# Patient Record
Sex: Male | Born: 1955
Health system: Southern US, Community
[De-identification: ages and names within clinical notes are randomized; demographics above are authoritative.]

## PROBLEM LIST (undated history)

## (undated) DIAGNOSIS — I10 Essential (primary) hypertension: Secondary | ICD-10-CM

## (undated) DIAGNOSIS — I509 Heart failure, unspecified: Secondary | ICD-10-CM

## (undated) HISTORY — DX: Heart failure, unspecified: I50.9

---

## 1998-12-31 ENCOUNTER — Encounter: Payer: Self-pay | Admitting: *Deleted

## 1998-12-31 ENCOUNTER — Emergency Department (HOSPITAL_COMMUNITY): Admission: EM | Admit: 1998-12-31 | Discharge: 1998-12-31 | Payer: Self-pay | Admitting: *Deleted

## 1999-08-30 ENCOUNTER — Encounter: Payer: Self-pay | Admitting: Emergency Medicine

## 1999-08-30 ENCOUNTER — Emergency Department (HOSPITAL_COMMUNITY): Admission: EM | Admit: 1999-08-30 | Discharge: 1999-08-30 | Payer: Self-pay | Admitting: Emergency Medicine

## 2005-07-30 ENCOUNTER — Emergency Department (HOSPITAL_COMMUNITY): Admission: EM | Admit: 2005-07-30 | Discharge: 2005-07-30 | Payer: Self-pay | Admitting: Family Medicine

## 2008-04-27 ENCOUNTER — Emergency Department (HOSPITAL_COMMUNITY): Admission: EM | Admit: 2008-04-27 | Discharge: 2008-04-27 | Payer: Self-pay | Admitting: Emergency Medicine

## 2012-06-29 ENCOUNTER — Encounter (HOSPITAL_COMMUNITY): Payer: Self-pay | Admitting: Emergency Medicine

## 2012-06-29 ENCOUNTER — Emergency Department (HOSPITAL_COMMUNITY)
Admission: EM | Admit: 2012-06-29 | Discharge: 2012-06-29 | Disposition: A | Discharge status: Home / Self Care | Payer: Self-pay | Attending: Emergency Medicine | Admitting: Emergency Medicine

## 2012-06-29 DIAGNOSIS — H15009 Unspecified scleritis, unspecified eye: Secondary | ICD-10-CM | POA: Insufficient documentation

## 2012-06-29 DIAGNOSIS — E119 Type 2 diabetes mellitus without complications: Secondary | ICD-10-CM | POA: Insufficient documentation

## 2012-06-29 DIAGNOSIS — I10 Essential (primary) hypertension: Secondary | ICD-10-CM | POA: Insufficient documentation

## 2012-06-29 DIAGNOSIS — H00019 Hordeolum externum unspecified eye, unspecified eyelid: Secondary | ICD-10-CM | POA: Insufficient documentation

## 2012-06-29 HISTORY — DX: Essential (primary) hypertension: I10

## 2012-06-29 MED ORDER — TOBRAMYCIN 0.3 % OP SOLN: 2.0000 [drp] | OPHTHALMIC | Status: AC

## 2012-06-29 NOTE — ED Notes (Signed)
20/20 eyesight for both eyes

## 2012-06-29 NOTE — ED Provider Notes (Cosign Needed)
History  Scribed for Ward Givens, MD, the patient was seen in room TR02C/TR02C. This chart was scribed by Candelaria Stagers. The patient's care started at 5:18 PM   CSN: [id number]  Arrival date & time 06/29/12  1450   First MD Initiated Contact with Patient 06/29/12 1648      Chief Complaint  Patient presents with  . Eye Pain     The history is provided by the patient. No language interpreter was used.   Louis Trevino is a 56 y.o. male who presents to the Emergency Department complaining of redness of the right eye that started six weeks ago.  Pt states that he had a sty on the right eyelid that started six weeks ago when the redness and swelling started.  Pt has been having problems with his right eye for about 9 months.  He was seen by his PCP three days ago and given antibiotic.  He reports that since starting the antibiotic three days ago the swelling and redness has gotten worse.  He denies fever or trouble seeing.  He reports that the eye is draining clear and white discharge.  Pt has h/o diabetes.  Pt describes abscess forming on his forehead and nose but doesn't have one now. 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   [facility]   Past Medical History  Diagnosis Date  . Diabetes mellitus   . Hypertension     No past surgical history on file.  No family history on file.  History  Substance Use Topics  . Smoking status: Never Smoker   . Smokeless tobacco: Not on file  . Alcohol Use: Yes   Lives with spouse Lives at home employed   Review of Systems  Eyes: Positive for pain (right eye pain), discharge (right eye) and redness (right eye). Negative for visual disturbance.  All other systems reviewed and are negative.    Allergies  Review of patient's allergies indicates no known allergies.  Home Medications   Current Outpatient Rx  Name Route Sig Dispense Refill  . AMLODIPINE BESYLATE PO Oral Take 1 tablet by  mouth daily.     . ASPIRIN EC 325 MG PO TBEC Oral Take 650 mg by mouth 2 (two) times daily.    Marland Kitchen BENAZEPRIL HCL 40 MG PO TABS Oral Take 40 mg by mouth at bedtime.    Marland Kitchen DOXAZOSIN MESYLATE 8 MG PO TABS Oral Take 8 mg by mouth at bedtime.    Marland Kitchen GLIPIZIDE PO Oral Take 1 tablet by mouth daily.     Marland Kitchen METOPROLOL TARTRATE 100 MG PO TABS Oral Take 100 mg by mouth 2 (two) times daily.    . SULFAMETHOXAZOLE-TMP DS 800-160 MG PO TABS Oral Take 1 tablet by mouth 2 (two) times daily.      BP 166/108  Pulse 91  Temp 98.1 F (36.7 C)  Resp 16  SpO2 96%  Vital signs normal except hypertension    Physical Exam  Nursing note and vitals reviewed. Constitutional: He is oriented to person, place, and time. He appears well-developed and well-nourished. No distress.  HENT:  Head: Normocephalic and atraumatic.  Right Ear: External ear normal.  Left Ear: External ear normal.  Eyes: EOM are normal. Pupils are equal, round, and reactive to light. Right eye exhibits chemosis and hordeolum. Right eye exhibits no discharge and no exudate. No foreign body present in the right eye. Left eye exhibits  no chemosis, no discharge, no exudate and no hordeolum. No foreign body present in the left eye. Right conjunctiva is injected. Left conjunctiva is not injected.         Intense injection of the lateral sclera of right eye.  Swelling of the sclera near the cornea.  Swelling and redness of the upper eyelied laterally involving lid margin.  When upper eyelid  lifted small pustular area found underneath the external sty.  Left eye normal.  Fluorescent stain with negative uptake.  Has mild swelling of the lower eyelid  Neck: Normal range of motion.  Pulmonary/Chest: No respiratory distress.  Musculoskeletal: Normal range of motion.  Neurological: He is alert and oriented to person, place, and time.  Skin: Skin is warm and dry. He is not diaphoretic.  Psychiatric: He has a normal mood and affect. His behavior is normal.     ED Course  Procedures   VA noted   DIAGNOSTIC STUDIES: Oxygen Saturation is 96% on room air, normal by my interpretation.    COORDINATION OF CARE:  CBG ordered, I was not given result.    1. Sty   2. Scleritis and episcleritis    New Prescriptions   TOBRAMYCIN (TOBREX) 0.3 % OPHTHALMIC SOLUTION    Place 2 drops into the right eye every 4 (four) hours.    Plan discharge  Devoria Albe, MD, FACEP    MDM   I personally performed the services described in this documentation, which was scribed in my presence. The recorded information has been reviewed and considered.  Devoria Albe, MD, Armando Gang        Ward Givens, MD 06/29/12 2012

## 2012-06-29 NOTE — ED Notes (Signed)
States was given bactrim but not helping

## 2012-06-29 NOTE — ED Notes (Signed)
Has fb in rt eye ? Bug  Has seen an ucc. Rt eye swollen and red

## 2013-07-25 ENCOUNTER — Emergency Department (HOSPITAL_COMMUNITY): Payer: Medicaid Other

## 2013-07-25 ENCOUNTER — Inpatient Hospital Stay (HOSPITAL_COMMUNITY)
Admission: EM | Admit: 2013-07-25 | Discharge: 2013-07-28 | DRG: 292 | Disposition: A | Discharge status: Home / Self Care | Payer: Medicaid Other | Attending: Internal Medicine | Admitting: Internal Medicine

## 2013-07-25 ENCOUNTER — Encounter (HOSPITAL_COMMUNITY): Payer: Self-pay | Admitting: Family Medicine

## 2013-07-25 ENCOUNTER — Ambulatory Visit: Payer: Self-pay | Admitting: Family Medicine

## 2013-07-25 ENCOUNTER — Encounter: Payer: Self-pay | Admitting: Family Medicine

## 2013-07-25 ENCOUNTER — Inpatient Hospital Stay (HOSPITAL_COMMUNITY): Payer: Medicaid Other

## 2013-07-25 VITALS — BP 150/110 | HR 116 | Temp 97.6°F | Resp 20 | Wt 238.0 lb

## 2013-07-25 DIAGNOSIS — R0602 Shortness of breath: Secondary | ICD-10-CM | POA: Insufficient documentation

## 2013-07-25 DIAGNOSIS — I10 Essential (primary) hypertension: Secondary | ICD-10-CM | POA: Diagnosis present

## 2013-07-25 DIAGNOSIS — I498 Other specified cardiac arrhythmias: Secondary | ICD-10-CM | POA: Diagnosis present

## 2013-07-25 DIAGNOSIS — Z79899 Other long term (current) drug therapy: Secondary | ICD-10-CM

## 2013-07-25 DIAGNOSIS — I5020 Unspecified systolic (congestive) heart failure: Secondary | ICD-10-CM | POA: Diagnosis present

## 2013-07-25 DIAGNOSIS — E876 Hypokalemia: Secondary | ICD-10-CM | POA: Diagnosis present

## 2013-07-25 DIAGNOSIS — R000 Tachycardia, unspecified: Secondary | ICD-10-CM

## 2013-07-25 DIAGNOSIS — I509 Heart failure, unspecified: Secondary | ICD-10-CM | POA: Diagnosis present

## 2013-07-25 DIAGNOSIS — IMO0001 Reserved for inherently not codable concepts without codable children: Secondary | ICD-10-CM | POA: Diagnosis present

## 2013-07-25 DIAGNOSIS — Z9119 Patient's noncompliance with other medical treatment and regimen: Secondary | ICD-10-CM | POA: Diagnosis not present

## 2013-07-25 DIAGNOSIS — Z7982 Long term (current) use of aspirin: Secondary | ICD-10-CM | POA: Diagnosis not present

## 2013-07-25 DIAGNOSIS — I5023 Acute on chronic systolic (congestive) heart failure: Principal | ICD-10-CM | POA: Diagnosis present

## 2013-07-25 DIAGNOSIS — Z91199 Patient's noncompliance with other medical treatment and regimen due to unspecified reason: Secondary | ICD-10-CM

## 2013-07-25 DIAGNOSIS — E669 Obesity, unspecified: Secondary | ICD-10-CM | POA: Diagnosis present

## 2013-07-25 DIAGNOSIS — E119 Type 2 diabetes mellitus without complications: Secondary | ICD-10-CM | POA: Diagnosis present

## 2013-07-25 LAB — CBC
Hemoglobin: 14.6 g/dL (ref 13.0–17.0)
MCH: 31.4 pg (ref 26.0–34.0)
MCH: 31 pg (ref 26.0–34.0)
MCHC: 35.7 g/dL (ref 30.0–36.0)
Platelets: 226 10*3/uL (ref 150–400)
Platelets: 219 10*3/uL (ref 150–400)
RBC: 5.03 MIL/uL (ref 4.22–5.81)
RBC: 4.71 MIL/uL (ref 4.22–5.81)
RDW: 13.7 % (ref 11.5–15.5)
WBC: 11.9 10*3/uL — ABNORMAL HIGH (ref 4.0–10.5)

## 2013-07-25 LAB — BASIC METABOLIC PANEL
CO2: 22 mEq/L (ref 19–32)
Calcium: 9 mg/dL (ref 8.4–10.5)
GFR calc Af Amer: >90 mL/min (ref >90)
Sodium: 132 mEq/L — ABNORMAL LOW (ref 135–145)

## 2013-07-25 LAB — CREATININE, SERUM
Creatinine, Ser: 1.25 mg/dL (ref 0.50–1.35)
GFR calc non Af Amer: 63 mL/min — ABNORMAL LOW (ref >90)

## 2013-07-25 LAB — PRO B NATRIURETIC PEPTIDE: Pro B Natriuretic peptide (BNP): 1221 pg/mL — ABNORMAL HIGH (ref 0–125)

## 2013-07-25 LAB — POCT I-STAT TROPONIN I: Troponin i, poc: 0.05 ng/mL (ref 0.00–0.08)

## 2013-07-25 LAB — GLUCOSE, CAPILLARY
Glucose-Capillary: 284 mg/dL — ABNORMAL HIGH (ref 70–99)
Glucose-Capillary: 409 mg/dL — ABNORMAL HIGH (ref 70–99)

## 2013-07-25 LAB — TROPONIN I
Troponin I: <0.3 ng/mL (ref <0.30)
Troponin I: <0.3 ng/mL (ref <0.30)

## 2013-07-25 LAB — HEMOGLOBIN A1C: Hgb A1c MFr Bld: 11.9 % — ABNORMAL HIGH (ref <5.7)

## 2013-07-25 MED ORDER — IOHEXOL 350 MG/ML SOLN
100.0000 mL | Freq: Once | INTRAVENOUS | Status: AC | PRN
  Administered 2013-07-25: 100 mL via INTRAVENOUS

## 2013-07-25 MED ORDER — SODIUM CHLORIDE 0.9 % IJ SOLN
3.0000 mL | INTRAMUSCULAR | Status: DC | PRN
  Administered 2013-07-27: 3 mL via INTRAVENOUS

## 2013-07-25 MED ORDER — ACETAMINOPHEN 650 MG RE SUPP: 650.0000 mg | Freq: Four times a day (QID) | RECTAL | Status: DC | PRN

## 2013-07-25 MED ORDER — HYDROCODONE-ACETAMINOPHEN 5-325 MG PO TABS: 1.0000 | ORAL_TABLET | ORAL | Status: DC | PRN

## 2013-07-25 MED ORDER — METOPROLOL TARTRATE 25 MG PO TABS
100.0000 mg | ORAL_TABLET | Freq: Two times a day (BID) | ORAL | Status: DC
  Administered 2013-07-25: 100 mg via ORAL
  Filled 2013-07-25: qty 4

## 2013-07-25 MED ORDER — LABETALOL HCL 5 MG/ML IV SOLN
10.0000 mg | INTRAVENOUS | Status: DC | PRN
  Filled 2013-07-25: qty 4

## 2013-07-25 MED ORDER — INSULIN ASPART 100 UNIT/ML ~~LOC~~ SOLN
0.0000 [IU] | Freq: Three times a day (TID) | SUBCUTANEOUS | Status: DC
  Administered 2013-07-25: 18:00:00 8 [IU] via SUBCUTANEOUS
  Administered 2013-07-26 (×2): via SUBCUTANEOUS
  Administered 2013-07-26 – 2013-07-27 (×3): 5 [IU] via SUBCUTANEOUS
  Administered 2013-07-27: 12:00:00 11 [IU] via SUBCUTANEOUS
  Administered 2013-07-28: 5 [IU] via SUBCUTANEOUS

## 2013-07-25 MED ORDER — ONDANSETRON HCL 4 MG PO TABS: 4.0000 mg | ORAL_TABLET | Freq: Four times a day (QID) | ORAL | Status: DC | PRN

## 2013-07-25 MED ORDER — ALBUTEROL SULFATE (5 MG/ML) 0.5% IN NEBU: 2.5000 mg | INHALATION_SOLUTION | RESPIRATORY_TRACT | Status: DC | PRN

## 2013-07-25 MED ORDER — INSULIN ASPART 100 UNIT/ML ~~LOC~~ SOLN: 0.0000 [IU] | Freq: Three times a day (TID) | SUBCUTANEOUS | Status: DC

## 2013-07-25 MED ORDER — HEPARIN SODIUM (PORCINE) 5000 UNIT/ML IJ SOLN
5000.0000 [IU] | Freq: Three times a day (TID) | INTRAMUSCULAR | Status: DC
  Administered 2013-07-25 – 2013-07-28 (×8): 5000 [IU] via SUBCUTANEOUS
  Filled 2013-07-25 (×11): qty 1

## 2013-07-25 MED ORDER — POTASSIUM CHLORIDE CRYS ER 20 MEQ PO TBCR
40.0000 meq | EXTENDED_RELEASE_TABLET | Freq: Once | ORAL | Status: AC
  Administered 2013-07-25: 40 meq via ORAL
  Filled 2013-07-25: qty 2

## 2013-07-25 MED ORDER — FUROSEMIDE 10 MG/ML IJ SOLN
40.0000 mg | Freq: Once | INTRAMUSCULAR | Status: AC
  Administered 2013-07-25: 40 mg via INTRAVENOUS
  Filled 2013-07-25: qty 4

## 2013-07-25 MED ORDER — FUROSEMIDE 10 MG/ML IJ SOLN
40.0000 mg | Freq: Two times a day (BID) | INTRAMUSCULAR | Status: DC
  Administered 2013-07-25 – 2013-07-26 (×3): 40 mg via INTRAVENOUS
  Filled 2013-07-25 (×4): qty 4

## 2013-07-25 MED ORDER — SODIUM CHLORIDE 0.9 % IJ SOLN
3.0000 mL | Freq: Two times a day (BID) | INTRAMUSCULAR | Status: DC
Start: 2013-07-25 — End: 2013-07-28
  Administered 2013-07-26: 22:00:00 3 mL via INTRAVENOUS

## 2013-07-25 MED ORDER — ASPIRIN EC 81 MG PO TBEC
81.0000 mg | DELAYED_RELEASE_TABLET | Freq: Every day | ORAL | Status: DC
  Administered 2013-07-25 – 2013-07-28 (×4): 81 mg via ORAL
  Filled 2013-07-25 (×4): qty 1

## 2013-07-25 MED ORDER — NITROGLYCERIN 2 % TD OINT
1.0000 [in_us] | TOPICAL_OINTMENT | Freq: Once | TRANSDERMAL | Status: AC
  Administered 2013-07-25: 1 [in_us] via TOPICAL
  Filled 2013-07-25: qty 1

## 2013-07-25 MED ORDER — INSULIN ASPART 100 UNIT/ML ~~LOC~~ SOLN
10.0000 [IU] | Freq: Once | SUBCUTANEOUS | Status: AC
  Administered 2013-07-25: 10 [IU] via SUBCUTANEOUS

## 2013-07-25 MED ORDER — ONDANSETRON HCL 4 MG/2ML IJ SOLN: 4.0000 mg | Freq: Four times a day (QID) | INTRAMUSCULAR | Status: DC | PRN

## 2013-07-25 MED ORDER — ALUM & MAG HYDROXIDE-SIMETH 200-200-20 MG/5ML PO SUSP: 30.0000 mL | Freq: Four times a day (QID) | ORAL | Status: DC | PRN

## 2013-07-25 MED ORDER — SODIUM CHLORIDE 0.9 % IJ SOLN
3.0000 mL | Freq: Two times a day (BID) | INTRAMUSCULAR | Status: DC
  Administered 2013-07-25 – 2013-07-28 (×5): 3 mL via INTRAVENOUS

## 2013-07-25 MED ORDER — BENAZEPRIL HCL 40 MG PO TABS
40.0000 mg | ORAL_TABLET | Freq: Every day | ORAL | Status: DC
  Filled 2013-07-25: qty 1

## 2013-07-25 MED ORDER — HYDRALAZINE HCL 20 MG/ML IJ SOLN: 10.0000 mg | INTRAMUSCULAR | Status: DC | PRN

## 2013-07-25 MED ORDER — AMLODIPINE BESYLATE 10 MG PO TABS
10.0000 mg | ORAL_TABLET | Freq: Every day | ORAL | Status: DC
  Administered 2013-07-25 – 2013-07-28 (×4): 10 mg via ORAL
  Filled 2013-07-25 (×4): qty 1

## 2013-07-25 MED ORDER — ACETAMINOPHEN 325 MG PO TABS
650.0000 mg | ORAL_TABLET | Freq: Four times a day (QID) | ORAL | Status: DC | PRN
  Administered 2013-07-26 – 2013-07-28 (×2): 650 mg via ORAL
  Filled 2013-07-25 (×2): qty 2

## 2013-07-25 MED ORDER — SODIUM CHLORIDE 0.9 % IV SOLN: 250.0000 mL | INTRAVENOUS | Status: DC | PRN

## 2013-07-25 MED ORDER — ALPRAZOLAM 0.5 MG PO TABS
0.5000 mg | ORAL_TABLET | Freq: Every day | ORAL | Status: DC | PRN
  Administered 2013-07-26: 18:00:00 0.5 mg via ORAL
  Filled 2013-07-25: qty 1

## 2013-07-25 MED ORDER — INSULIN ASPART 100 UNIT/ML ~~LOC~~ SOLN
10.0000 [IU] | Freq: Once | SUBCUTANEOUS | Status: AC
  Administered 2013-07-25: 10 [IU] via INTRAVENOUS
  Filled 2013-07-25: qty 1

## 2013-07-25 MED ORDER — LABETALOL HCL 5 MG/ML IV SOLN
20.0000 mg | Freq: Once | INTRAVENOUS | Status: AC
  Administered 2013-07-25: 20 mg via INTRAVENOUS
  Filled 2013-07-25: qty 4

## 2013-07-25 NOTE — ED Notes (Signed)
Called report to nurse on 4e. She verbalized concern over pt distolic bp. Also concern over pt increased hr. Paged dr Jerral Ralph (admitting doctor [phone]) and he is coming to see pt. Would like ct done before pt goes to floor. He will address concerns

## 2013-07-25 NOTE — ED Provider Notes (Signed)
CSN: [id number]     Arrival date & time 07/25/13  1124 History   First MD Initiated Contact with Patient 07/25/13 1145     Chief Complaint  Patient presents with  . Shortness of Breath   (Consider location/radiation/quality/duration/timing/severity/associated sxs/prior Treatment) HPI Patient with history of hypertension diabetes who has been on Lasix in the past for lower extremity edema presents with increased shortness of breath for the past 2-3 days. Patient states the shortness of breath it is worse with any exertion or when lying flat. He denies cough, fevers, chills or chest pain. He was taken off his Lasix one year ago. Patient has no new lower extremity edema or pain. Past Medical History  Diagnosis Date  . Diabetes mellitus   . Hypertension    History reviewed. No pertinent past surgical history. Family History  Problem Relation Age of Onset  . Heart disease Mother   . Stroke Mother   . Kidney failure Maternal Grandfather   . Stroke Paternal Grandmother    History  Substance Use Topics  . Smoking status: Never Smoker   . Smokeless tobacco: Not on file  . Alcohol Use: Yes    Review of Systems  Constitutional: Negative for fever and chills.  HENT: Negative for neck pain and neck stiffness.   Respiratory: Positive for shortness of breath. Negative for cough.   Cardiovascular: Negative for chest pain, palpitations and leg swelling.  Gastrointestinal: Negative for nausea, vomiting, abdominal pain and diarrhea.  Musculoskeletal: Negative for myalgias and back pain.  Skin: Negative for rash and wound.  Neurological: Negative for dizziness, weakness, light-headedness, numbness and headaches.  All other systems reviewed and are negative.    Allergies  Review of patient's allergies indicates no known allergies.  Home Medications   Current Outpatient Rx  Name  Route  Sig  Dispense  Refill  . AMLODIPINE BESYLATE PO   Oral   Take 10 mg by mouth daily.          Marland Kitchen  aspirin EC 325 MG tablet   Oral   Take 650 mg by mouth 2 (two) times daily.         . benazepril (LOTENSIN) 40 MG tablet   Oral   Take 40 mg by mouth at bedtime.         Marland Kitchen doxazosin (CARDURA) 8 MG tablet   Oral   Take 8 mg by mouth at bedtime.         Marland Kitchen GLIPIZIDE PO   Oral   Take 500 mg by mouth 2 (two) times daily.          . metoprolol (LOPRESSOR) 100 MG tablet   Oral   Take 100 mg by mouth 2 (two) times daily.          BP 201/107  Pulse 131  Temp(Src) 97.8 F (36.6 C) (Oral)  Resp 24  Ht 5\' 7"  (1.702 m)  Wt 240 lb 1.6 oz (108.909 kg)  BMI 37.6 kg/m2  SpO2 94% Physical Exam  Nursing note and vitals reviewed. Constitutional: He is oriented to person, place, and time. He appears well-developed and well-nourished. No distress.  HENT:  Head: Normocephalic and atraumatic.  Mouth/Throat: Oropharynx is clear and moist.  Eyes: EOM are normal. Pupils are equal, round, and reactive to light.  Neck: Normal range of motion. Neck supple.  Cardiovascular: Normal rate and regular rhythm.   Pulmonary/Chest: Effort normal. No respiratory distress. He has no wheezes. He has no rales. He exhibits no tenderness.  Mildly increased expiratory phase linked. Questionable bilateral crackles in bases  Abdominal: Soft. Bowel sounds are normal. He exhibits no distension and no mass. There is no tenderness. There is no rebound and no guarding.  Musculoskeletal: Normal range of motion. He exhibits no edema and no tenderness.  No calf swelling or tenderness.  Neurological: He is alert and oriented to person, place, and time.  Patient is alert and oriented x3 with clear, goal oriented speech. Patient has 5/5 motor in all extremities. Sensation is intact to light touch.  Skin: Skin is warm and dry. No rash noted. No erythema.  Psychiatric: He has a normal mood and affect. His behavior is normal.    ED Course  Procedures (including critical care time) Labs Review Labs Reviewed  CBC   BASIC METABOLIC PANEL  PRO B NATRIURETIC PEPTIDE   Imaging Review No results found.  Date: 07/25/2013  Rate: 129  Rhythm: sinus tachycardia  QRS Axis: normal  Intervals: normal  ST/T Wave abnormalities: nonspecific ST changes  Conduction Disutrbances:none  Narrative Interpretation:   Old EKG Reviewed: none available   MDM  Concern for cardiac related shortness of breath. Patient with elevated BNP there chest x-ray looks more consistent with bronchitis per radiology. Patient states he's been noncompliant with his medication including his metoprolol. This may account for his tachycardia. Discussed with [facility]. Will admit to the hospital but suggests getting CT imaging of his chest to rule out PE in the emergency department.    Loren Racer, MD 07/26/13 1242

## 2013-07-25 NOTE — Progress Notes (Addendum)
  Subjective:    Patient ID: Louis Trevino, male    DOB: [birth year], 57 y.o.   MRN: [id number]  HPI 57 year old male with history of hypertension uncontrolled and diabetes mellitus. He presented with 3 days of shortness of breath worse at bedtime. He's also noticed some wheezing and cough which is been nonproductive. He has no history of COPD no tobacco use no history of congestive heart failure. He states he is unable to lie flat before because he feels like he suffocating and he can't breathe. He's also had some chest tightness associated with the symptoms. Taken his blood pressure medications as prescribed. He is here today with his significant other  Review of Systems  GEN- denies fatigue, fever, weight loss,weakness, recent illness HEENT- denies eye drainage, change in vision, nasal discharge, CVS- denies chest pain, palpitations RESP- +SOB, +cough,+ wheeze ABD- denies N/V, change in stools, abd pain GU- denies dysuria, hematuria, dribbling, incontinence MSK- denies joint pain, muscle aches, injury Neuro- denies headache, dizziness, syncope, seizure activity      Objective:   Physical Exam GEN- NAD, alert and oriented x3,obese HEENT- PERRL, EOMI, non injected sclera, pink conjunctiva, MMM, oropharynx clear Neck- Supple, JVD 3cm   CVS- tachycardia, no murmur RESP-bilateral crackles, normal WOB at rest, oxygen sat 88% , no wheeze ABD-NABS,soft,NT,ND EXT- Trace edema Pulses- Radial 2+        Assessment & Plan:

## 2013-07-25 NOTE — ED Notes (Signed)
Pt has returned from ct . Peggy rn made aware pt coming to 4e

## 2013-07-25 NOTE — Assessment & Plan Note (Signed)
Uncontrolled blood pressure he is currently on 4 medications. We'll defer to evaluation in emergency room

## 2013-07-25 NOTE — ED Notes (Signed)
PT reported he did not take the morning dose of B/P meds.

## 2013-07-25 NOTE — Patient Instructions (Addendum)
Go directly to ER EKG and CXR will be needed

## 2013-07-25 NOTE — H&P (Signed)
PATIENT DETAILS Name: Louis Trevino Age: 57 y.o. Sex: male Date of Birth: 11-02-1955 Admit Date: 07/25/2013 [id number] TOM, MD   CHIEF COMPLAINT:  Shortness of breath 3 days  HPI: Louis Trevino is a 57 y.o. male with a Past Medical History of hypertension, diabetes, noncompliant with medications who presents today with the above noted complaint. Per patient, approximately 3 days ago he noted that he was short of breath from walking from his mailbox to his house. He also noted that he had shortness of breath upon lying flat. Shortness of breath is only present during exertion and on lying flat, it is not otherwise present. There is some associated mild swelling of his legs. There is no cough. He was seen at his primary care practitioner's office today, was found to have uncontrolled hypertension and uncontrolled diabetes, and was sent to the emergency room for further evaluation. In the emergency room, he was found to be tachycardic, initial O2 saturation was in the 80s. A chest x-ray did not show any overt pulmonary edema, patient was given Lasix and has felt better since then. I was subsequently asked to admit this patient for further evaluation and treatment. During my evaluation, patient seemed comfortable, he was not short of breath. He denied any chest pain, nausea, vomiting or diarrhea. He denied any abdominal pain as well. CT angiogram of the chest has been ordered by the emergency room physician, and is currently pending.   ALLERGIES:  No Known Allergies  PAST MEDICAL HISTORY: Past Medical History  Diagnosis Date  . Diabetes mellitus   . Hypertension     PAST SURGICAL HISTORY: History reviewed. No pertinent past surgical history.  MEDICATIONS AT HOME: Prior to Admission medications   Medication Sig Start Date End Date Taking? Authorizing Provider  ALPRAZolam Prudy Feeler) 0.5 MG tablet Take 0.5 mg by mouth daily as needed for anxiety.   Yes Historical Provider, MD   amLODipine (NORVASC) 10 MG tablet Take 10 mg by mouth daily.   Yes Historical Provider, MD  benazepril (LOTENSIN) 40 MG tablet Take 40 mg by mouth at bedtime.   Yes Historical Provider, MD  doxazosin (CARDURA) 8 MG tablet Take 8 mg by mouth at bedtime.   Yes Historical Provider, MD  glipiZIDE (GLUCOTROL) 5 MG tablet Take 5 mg by mouth 2 (two) times daily before a meal.   Yes Historical Provider, MD  metFORMIN (GLUCOPHAGE) 500 MG tablet Take 500 mg by mouth 2 (two) times daily with a meal.   Yes Historical Provider, MD  metoprolol (LOPRESSOR) 100 MG tablet Take 100 mg by mouth 2 (two) times daily.   Yes Historical Provider, MD  naproxen sodium (ANAPROX) 220 MG tablet Take 440 mg by mouth daily as needed (for pain).   Yes Historical Provider, MD    FAMILY HISTORY: Family History  Problem Relation Age of Onset  . Heart disease Mother   . Stroke Mother   . Kidney failure Maternal Grandfather   . Stroke Paternal Grandmother     SOCIAL HISTORY:  reports that he has never smoked. He does not have any smokeless tobacco history on file. He reports that  drinks alcohol. His drug history is not on file.  REVIEW OF SYSTEMS:  Constitutional:   No  weight loss, night sweats,  Fevers, chills, fatigue.  HEENT:    No headaches, Difficulty swallowing,Tooth/dental problems,Sore throat,  No sneezing, itching, ear ache, nasal congestion, post nasal drip,   Cardio-vascular: No chest pain,  PND, anasarca, dizziness, palpitations  GI:  No heartburn, indigestion, abdominal pain, nausea, vomiting, diarrhea, change in       bowel habits, loss of appetite  Resp: No shortness of breath  at rest.  No excess mucus, no productive cough, No non-productive cough,  No coughing up of blood.No change in color of mucus.No wheezing.No chest wall deformity  Skin:  no rash or lesions.  GU:  no dysuria, change in color of urine, no urgency or frequency.  No flank pain.  Musculoskeletal: No joint pain or  swelling.  No decreased range of motion.  No back pain.  Psych: No change in mood or affect. No depression or anxiety.  No memory loss.   PHYSICAL EXAM: Blood pressure 166/119, pulse 117, temperature 97.8 F (36.6 C), temperature source Oral, resp. rate 20, height 5\' 7"  (1.702 m), weight 108.909 kg (240 lb 1.6 oz), SpO2 95.00%.  General appearance :Awake, alert, not in any distress. Speech Clear. Not toxic Looking HEENT: Atraumatic and Normocephalic, pupils equally reactive to light and accomodation Neck: supple, no JVD. No cervical lymphadenopathy.  Chest:Good air entry bilaterally, few bibasilar rales CVS: S1 S2 regular, no murmurs.  Abdomen: Bowel sounds present, Non tender and not distended with no gaurding, rigidity or rebound. Extremities: B/L Lower Ext shows 1+ edema, both legs are warm to touch Neurology: Awake alert, and oriented X 3, CN II-XII intact, Non focal Skin:No Rash Wounds:N/A  LABS ON ADMISSION:   Recent Labs  07/25/13 1143  NA 132*  K 3.3*  CL 95*  CO2 22  GLUCOSE 449*  BUN 21  CREATININE 1.05  CALCIUM 9.0   No results found for this basename: AST, ALT, ALKPHOS, BILITOT, PROT, ALBUMIN,  in the last 72 hours No results found for this basename: LIPASE, AMYLASE,  in the last 72 hours  Recent Labs  07/25/13 1143  WBC 11.9*  HGB 15.8  HCT 43.6  MCV 86.7  PLT 226   No results found for this basename: CKTOTAL, CKMB, CKMBINDEX, TROPONINI,  in the last 72 hours No results found for this basename: DDIMER,  in the last 72 hours No components found with this basename: POCBNP,    RADIOLOGIC STUDIES ON ADMISSION: Dg Chest 2 View  07/25/2013   CLINICAL DATA:  Cough, shortness of Breath for 3 days  EXAM: CHEST  2 VIEW  COMPARISON:  None.  FINDINGS: Cardiomediastinal silhouette is unremarkable. No acute infiltrate or pulmonary edema. Mild degenerative changes mid and lower thoracic spine. Mild perihilar and infrahilar increased bronchial markings suspicious  for bronchitic changes.  IMPRESSION: No acute infiltrate or pulmonary edema. Mild perihilar and infrahilar increased bronchial markings suspicious for bronchitic changes.   Electronically Signed   By: Natasha Mead   On: 07/25/2013 12:59     EKG: Independently reviewed. Sinus tach  ASSESSMENT AND PLAN: Present on Admission:  . SOB (shortness of breath) - Symptoms, and some clinical signs are suggestive of acute congestive heart failure-not sure at this time whether this is diastolic or systolic. However given his symptoms and chest x-ray findings, his degree of hypoxia and tachycardia seems out of proportion, agree with getting a CT angiogram chest to make sure there is no pulmonary embolism.  - For now will treat CHF, start Lasix, continue with beta blockers and ACE inhibitor  - Await echocardiogram, await CT angiogram, monitor in telemetry and cycle cardiac enzymes   . HTN (hypertension), malignant - Continue with amlodipine, metoprolol and benazepril, and Lasix 40 mg twice a day. - Will adjust  medications depending on blood pressure trend  - Will order as needed hydralazine and labetalol   . DM (diabetes mellitus) - Uncontrolled  - Check A1c  - Hold metformin and glipizide  - Will place on SSI  . Sinus tachycardia - Monitor in telemetry, getting a CT angiogram of the chest in nature no PE. Check echocardiogram. - Check TSH  . Noncompliance with medication - Patient has been counseled, importance of being compliant medications has been reemphasized  Further plan will depend as patient's clinical course evolves and further radiologic and laboratory data become available. Patient will be monitored closely.  DVT Prophylaxis: Prophylactic Lovenox  Code Status: Full Code  Total time spent for admission equals 45 minutes.  [facility] Pager [phone]  If 7PM-7AM, please contact night-coverage [email or web address] Password [facility] 07/25/2013, 3:08 PM

## 2013-07-25 NOTE — ED Notes (Signed)
sts SOB with exertion over the past 2 days and the past 2 nights he has been unable to lay flat. sts he feels like he cant get enough air. Denies chest pain.

## 2013-07-25 NOTE — ED Notes (Signed)
Spoke with megan. She advises pt is next for ct.

## 2013-07-25 NOTE — Assessment & Plan Note (Addendum)
Patient with acute onset of shortness of breath and hypoxia. His exam is concerning for possible underlying congestive heart failure other differentials include bronchitis, CAP, PE. He requires EKG blood work including BNP and chest x-ray however he refused to have this done states he is uninsured and will be unable to proceed. I advised him that I cannot work him up for prescribe him any medications without having appropriate testing and that he was unsafe to go home secondary to his hypoxia which was noted as well as his uncontrolled hypertension. He agreed to go to the emergency room since he would not have to pay anything up for he can still be evaluated. I agree with this as he is at risk by going home with his current symptoms and exam.

## 2013-07-25 NOTE — ED Notes (Signed)
Admitting doctor at bedside 

## 2013-07-26 LAB — GLUCOSE, CAPILLARY
Glucose-Capillary: 209 mg/dL — ABNORMAL HIGH (ref 70–99)
Glucose-Capillary: 258 mg/dL — ABNORMAL HIGH (ref 70–99)
Glucose-Capillary: 286 mg/dL — ABNORMAL HIGH (ref 70–99)
Glucose-Capillary: 390 mg/dL — ABNORMAL HIGH (ref 70–99)

## 2013-07-26 LAB — BASIC METABOLIC PANEL
BUN: 16 mg/dL (ref 6–23)
CO2: 31 mEq/L (ref 19–32)
Calcium: 8.8 mg/dL (ref 8.4–10.5)
Chloride: 97 mEq/L (ref 96–112)
Creatinine, Ser: 1.16 mg/dL (ref 0.50–1.35)
GFR calc Af Amer: 80 mL/min — ABNORMAL LOW (ref >90)
GFR calc non Af Amer: 69 mL/min — ABNORMAL LOW (ref >90)
Glucose, Bld: 192 mg/dL — ABNORMAL HIGH (ref 70–99)
Sodium: 138 mEq/L (ref 135–145)

## 2013-07-26 LAB — CBC
HCT: 41 % (ref 39.0–52.0)
MCH: 31 pg (ref 26.0–34.0)
MCHC: 35.6 g/dL (ref 30.0–36.0)
MCV: 87 fL (ref 78.0–100.0)
Platelets: 224 10*3/uL (ref 150–400)
RBC: 4.71 MIL/uL (ref 4.22–5.81)
RDW: 13.8 % (ref 11.5–15.5)
WBC: 10.4 10*3/uL (ref 4.0–10.5)

## 2013-07-26 LAB — TROPONIN I: Troponin I: <0.3 ng/mL (ref <0.30)

## 2013-07-26 MED ORDER — LISINOPRIL 5 MG PO TABS
5.0000 mg | ORAL_TABLET | Freq: Two times a day (BID) | ORAL | Status: DC
  Administered 2013-07-27 – 2013-07-28 (×3): 5 mg via ORAL
  Filled 2013-07-26 (×5): qty 1

## 2013-07-26 MED ORDER — GLUCOSE 40 % PO GEL: 1.0000 | ORAL | Status: DC | PRN

## 2013-07-26 MED ORDER — CARVEDILOL 3.125 MG PO TABS
3.1250 mg | ORAL_TABLET | Freq: Two times a day (BID) | ORAL | Status: DC
  Filled 2013-07-26: qty 1

## 2013-07-26 MED ORDER — CARVEDILOL 3.125 MG PO TABS
3.1250 mg | ORAL_TABLET | Freq: Two times a day (BID) | ORAL | Status: DC
  Administered 2013-07-26 – 2013-07-27 (×2): 3.125 mg via ORAL
  Filled 2013-07-26 (×4): qty 1

## 2013-07-26 MED ORDER — POTASSIUM CHLORIDE CRYS ER 20 MEQ PO TBCR
20.0000 meq | EXTENDED_RELEASE_TABLET | Freq: Two times a day (BID) | ORAL | Status: DC
  Administered 2013-07-26: 20 meq via ORAL
  Filled 2013-07-26 (×3): qty 1

## 2013-07-26 MED ORDER — POTASSIUM CHLORIDE CRYS ER 20 MEQ PO TBCR
40.0000 meq | EXTENDED_RELEASE_TABLET | Freq: Once | ORAL | Status: AC
  Administered 2013-07-26: 40 meq via ORAL
  Filled 2013-07-26: qty 2
  Filled 2013-07-26: qty 1

## 2013-07-26 MED ORDER — METOPROLOL TARTRATE 1 MG/ML IV SOLN
2.5000 mg | Freq: Once | INTRAVENOUS | Status: DC
  Administered 2013-07-26: 2.5 mg via INTRAVENOUS
  Filled 2013-07-26: qty 5

## 2013-07-26 MED ORDER — DEXTROSE 50 % IV SOLN: 50.0000 mL | Freq: Once | INTRAVENOUS | Status: AC | PRN

## 2013-07-26 MED ORDER — POTASSIUM CHLORIDE CRYS ER 20 MEQ PO TBCR
40.0000 meq | EXTENDED_RELEASE_TABLET | Freq: Once | ORAL | Status: AC
  Administered 2013-07-26: 13:00:00 40 meq via ORAL
  Filled 2013-07-26: qty 2

## 2013-07-26 MED ORDER — DEXTROSE 50 % IV SOLN: 25.0000 mL | Freq: Once | INTRAVENOUS | Status: AC | PRN

## 2013-07-26 MED ORDER — FUROSEMIDE 10 MG/ML IJ SOLN
40.0000 mg | Freq: Three times a day (TID) | INTRAMUSCULAR | Status: DC
  Administered 2013-07-26 – 2013-07-28 (×5): 40 mg via INTRAVENOUS
  Filled 2013-07-26 (×8): qty 4

## 2013-07-26 MED ORDER — INSULIN GLARGINE 100 UNIT/ML ~~LOC~~ SOLN
15.0000 [IU] | Freq: Every day | SUBCUTANEOUS | Status: DC
  Administered 2013-07-26: 15 [IU] via SUBCUTANEOUS
  Filled 2013-07-26 (×2): qty 0.15

## 2013-07-26 MED ORDER — INSULIN ASPART 100 UNIT/ML ~~LOC~~ SOLN
5.0000 [IU] | Freq: Once | SUBCUTANEOUS | Status: AC
  Administered 2013-07-26: 23:00:00 5 [IU] via SUBCUTANEOUS

## 2013-07-26 NOTE — Consult Note (Addendum)
Admit date: 07/25/2013 Referring Physician  [facility] Primary Physician Lynnea Ferrier, M.D. Primary Cardiologist  H. WB Leia Alf, M.D. Reason for Consultation  CHF  ASSESSMENT: 1. Acute on chronic systolic heart failure.  2. Hypertension with poor control and medication noncompliance.  3. Long-standing diabetes mellitus.  4. Obesity.  5. Snoring and apnea while sleeping, rule out sleep apnea  PLAN:  1. Institute in titrated heart failure therapy, including beta blocker, ACE inhibitor therapy, and diuretic therapy for symptomatic improvement.  2. Blood pressure control with as many agents as required to get pressures less than 130/85 mmHg  3. We'll need to have an ischemic evaluation at a later date once heart failure is well controlled. No current symptoms to suggest angina.  4. Will eventually need to have a sleep study done to rule out obstructive sleep apnea  HPI: The patient gives a 2-3 day history of exertional dyspnea. In 5-7 day history of orthopnea. He has stopped taking his medications after being laid off from work. He is a known history of severe hypertension for at least 10 years and diabetes for the same timeframe. He has not had chest discomfort. When he was first diagnosed with hypertension. He underwent a cardiac evaluation including coronary angiography, which he states did not reveal blockage.   PMH:   Past Medical History  Diagnosis Date  . Diabetes mellitus   . Hypertension      PSH:  History reviewed. No pertinent past surgical history.  Allergies:  Review of patient's allergies indicates no known allergies. Prior to Admit Meds:   Prescriptions prior to admission  Medication Sig Dispense Refill  . ALPRAZolam (XANAX) 0.5 MG tablet Take 0.5 mg by mouth daily as needed for anxiety.      Marland Kitchen amLODipine (NORVASC) 10 MG tablet Take 10 mg by mouth daily.      . benazepril (LOTENSIN) 40 MG tablet Take 40 mg by mouth at bedtime.      Marland Kitchen doxazosin  (CARDURA) 8 MG tablet Take 8 mg by mouth at bedtime.      Marland Kitchen glipiZIDE (GLUCOTROL) 5 MG tablet Take 5 mg by mouth 2 (two) times daily before a meal.      . metFORMIN (GLUCOPHAGE) 500 MG tablet Take 500 mg by mouth 2 (two) times daily with a meal.      . metoprolol (LOPRESSOR) 100 MG tablet Take 100 mg by mouth 2 (two) times daily.      . naproxen sodium (ANAPROX) 220 MG tablet Take 440 mg by mouth daily as needed (for pain).       Fam HX:    Family History  Problem Relation Age of Onset  . Heart disease Mother   . Stroke Mother   . Kidney failure Maternal Grandfather   . Stroke Paternal Grandmother    Social HX:    History   Social History  . Marital Status: Widowed    Spouse Name: N/A    Number of Children: N/A  . Years of Education: N/A   Occupational History  . Not on file.   Social History Main Topics  . Smoking status: Never Smoker   . Smokeless tobacco: Not on file  . Alcohol Use: Yes  . Drug Use: No  . Sexual Activity: Not on file   Other Topics Concern  . Not on file   Social History Narrative  . No narrative on file     Review of Systems: Denies weight loss, hemoptysis, melena, transient  neurological symptoms, edema, stroke, lung disease, and liver disease. No known history of kidney disease. He denies drug use.  Physical Exam: Blood pressure 149/105, pulse 115, temperature 97.6 F (36.4 C), temperature source Oral, resp. rate 18, height 5\' 7"  (1.702 m), weight 107.094 kg (236 lb 1.6 oz), SpO2 93.00%. Weight change:    Obese bearded gentleman in no distress.   HEENT exam is unremarkable.  Neck veins are difficult to assess due to the presence of bearded and short stubby neck. No carotid bruits are heard.  Rales are heard. 1/4-1/3 of the way up. The lungs bilaterally, posterior. No wheezing is heard.  The heart rate is fast and there is a summation gallop. No murmurs heard.  Abdomen is obese. Liver and spleen are not palpable.  Trace to 1+ edema is  noted.  Neurological exam is grossly intact. No motor, sensory deficits noted. He  is alert and oriented x3 Labs:   Lab Results  Component Value Date   WBC 10.4 07/26/2013   HGB 14.6 07/26/2013   HCT 41.0 07/26/2013   MCV 87.0 07/26/2013   PLT 224 07/26/2013    Recent Labs Lab 07/26/13 0535  NA 138  K 2.7*  CL 97  CO2 31  BUN 16  CREATININE 1.16  CALCIUM 8.8  GLUCOSE 192*   BNP    Component Value Date/Time   PROBNP 1221.0* 07/25/2013 1143    Radiology:  Dg Chest 2 View  07/25/2013   CLINICAL DATA:  Cough, shortness of Breath for 3 days  EXAM: CHEST  2 VIEW  COMPARISON:  None.  FINDINGS: Cardiomediastinal silhouette is unremarkable. No acute infiltrate or pulmonary edema. Mild degenerative changes mid and lower thoracic spine. Mild perihilar and infrahilar increased bronchial markings suspicious for bronchitic changes.  IMPRESSION: No acute infiltrate or pulmonary edema. Mild perihilar and infrahilar increased bronchial markings suspicious for bronchitic changes.   Electronically Signed   By: Natasha Mead   On: 07/25/2013 12:59   Ct Angio Chest Pe W/cm &/or Wo Cm  07/25/2013   CLINICAL DATA:  Shortness of breath for 3 days  EXAM: CT ANGIOGRAPHY CHEST WITH CONTRAST  TECHNIQUE: Multidetector CT imaging of the chest was performed using the standard protocol during bolus administration of intravenous contrast. Multiplanar CT image reconstructions including MIPs were obtained to evaluate the vascular anatomy.  CONTRAST:  OMNIPAQUE IOHEXOL 350 MG/ML SOLN  COMPARISON:  None.  FINDINGS: Small bilateral stress set there is a small right pleural effusion identified. Mild dependent changes and atelectasis identified within the lower lobes.  Heart size is mildly enlarged. At right peritracheal lymph node measures 1.9 cm, image 32/series 4. There is a AP window lymph node which measures stress set there is a lymph node adjacent to the main pulmonary artery which measures 1.1 cm, image 32/series 4.  Sub- carinal lymph node measures 1.2 cm, image 51/series 4.  The main pulmonary artery appears normal. No saddle embolus identified. There is no abnormal filling defects within the main pulmonary artery or its branches to suggest an acute pulmonary embolus.  No enlarged axillary or supraclavicular lymph nodes. Limited imaging through the upper abdomen demonstrates no acute findings. Review of the visualized osseous structures is significant for mild multilevel spondylosis. No aggressive lytic or sclerotic bone lesions identified.  Review of the MIP images confirms the above findings.  IMPRESSION: 1. No evidence for acute pulmonary embolus  2. Small right pleural effusion and bibasilar atelectasis.  3. Borderline enlarged hilar and mediastinal lymph  nodes. This is a nonspecific finding. Differential considerations include reactive adenopathy, metastatic disease, granulomatous inflammation or infection or lymphoma. Consider followup imaging in 3 months to confirm stability.   Electronically Signed   By: Signa Kell M.D.   On: 07/25/2013 16:46   EKG:   sinus tachycardia, QS pattern V1 through V3, prominent voltage compatible with LVH, left axis deviation. No acute ST-T wave change     Lesleigh Noe 07/26/2013 8:19 PM

## 2013-07-26 NOTE — Clinical Documentation Improvement (Signed)
THIS DOCUMENT IS NOT A PERMANENT PART OF THE MEDICAL RECORD  Please update your documentation with the medical record to reflect your response to this query. If you need help knowing how to do this please call [phone].  07/26/13  Dear Dr. [facility],  In a better effort to capture your patient's severity of illness, reflect appropriate length of stay and utilization of resources, a review of the patient medical record has revealed the following indicators the diagnosis of Heart Failure.    Based on your clinical judgment, please clarify and document CHF TYPE & ACUITY in a progress note and/or discharge summary the clinical condition associated with the following supporting information:  In responding to this query please exercise your independent judgment.  The fact that a query is asked, does not imply that any particular answer is desired or expected.  Possible Clinical Conditions?  Chronic Systolic Congestive Heart Failure Chronic Diastolic Congestive Heart Failure Chronic Systolic & Diastolic Congestive Heart Failure Acute Systolic Congestive Heart Failure Acute Diastolic Congestive Heart Failure Acute Systolic & Diastolic Congestive Heart Failure Acute on Chronic Systolic Congestive Heart Failure Acute on Chronic Diastolic Congestive Heart Failure Acute on Chronic Systolic & Diastolic  Congestive Heart Failure Other Condition________________________________________ Cannot Clinically Determine  Supporting Information:  Risk Factors:(As per notes) " CHF" Signs & Symptoms:(As per notes)"- Symptoms, and some clinical signs are suggestive of acute congestive heart failure"  Diagnostics: CXR=07/25/13 IMPRESSION: No acute infiltrate or pulmonary edema. Mild perihilar and infrahilar increased bronchial markings suspicious for bronchitic changes.  CT/ANGIO CHEST=07/25/13 IMPRESSION: 1. No evidence for acute pulmonary embolus 2. Small right pleural effusion and bibasilar  atelectasis. 3. Borderline enlarged hilar and mediastinal lymph nodes. This is a nonspecific finding. Differential considerations include reactive adenopathy, metastatic disease, granulomatous inflammation or infection or lymphoma. Consider followup imaging in 3 months to confirm stability.   Treatment:(As per notes)start Lasix, continue with beta blockers and ACE inhibitor    Reviewed:  no additional documentation provided  Please note-During ADMISSION it not know whether this is systolic or diastolic, I have not seen the patient since then, please direct this query to the rounding and discharging MD. Thanks  Thank You,  Nevin Bloodgood RN, BSN, CCDS  Clinical Documentation Specialist: [phone] Cell=[phone] Health Information Management [place]

## 2013-07-26 NOTE — Progress Notes (Signed)
Utilization Review Completed.   Lilygrace Rodick, RN, BSN Nurse Case Manager  [phone]  

## 2013-07-26 NOTE — Progress Notes (Signed)
[facility]                                                                                Patient Demographics  Louis Trevino, is a 57 y.o. male, DOB - 1956/01/22, ZOX:[id number]  Admit date - 07/25/2013   Admitting Physician Louis Shorter Levora Dredge, MD  Outpatient Primary MD for the patient is Louis Grosser, MD  LOS - 1   Chief Complaint  Patient presents with  . Shortness of Breath        Assessment & Plan  1. SOB (shortness of breath)  Likely secondary to new onset CHF. Louis Trevino echocardiogram as well as cardiology consult. Will continue to diurese.  CT of the chest was negative for pulmonary embolism.  2. HTN (hypertension), malignant  -Likely secondary to patient not taking his medications. Continue his amlodipine metoprolol Benzapril Lasix. Order hydralazine when necessary.  3. DM (diabetes mellitus)  Hemiglobin A1c found to be 11.9. Patient will need to be on insulin therapy once discharged. Currently is on insulin sliding scale. His metformin has been held.    4. Sinus tachycardia  PE has been ruled out. We'll continue to monitor patient. His TSH was found to be within normal limits.  5.Noncompliance with medication  Patient has daily has not seen a physician in some time and has not been taking his medications as he should be. He has been counseled on this.   Code Status: Full  Family Communication: None  Disposition Plan: Admitted   Procedures Echocardiogram   Consults  Cardiology   DVT Prophylaxis  Heparin 5000 units SQ  Lab Results  Component Value Date   PLT 224 07/26/2013    Medications  Scheduled Meds: . amLODipine  10 mg Oral Daily  . aspirin EC  81 mg Oral Daily  . furosemide  40 mg Intravenous Q12H  . heparin  5,000 Units Subcutaneous Q8H  . insulin aspart  0-15 Units Subcutaneous TID WC  . sodium chloride  3 mL Intravenous Q12H  . sodium chloride  3 mL Intravenous Q12H   Continuous Infusions:  PRN Meds:.sodium chloride,  acetaminophen, acetaminophen, albuterol, ALPRAZolam, alum & mag hydroxide-simeth, hydrALAZINE, HYDROcodone-acetaminophen, labetalol, ondansetron (ZOFRAN) IV, ondansetron, sodium chloride  Antibiotics    Anti-infectives   None       Time Spent in minutes   35 minutes   Louis Trevino D.O. on 07/26/2013 at 2:19 PM  Between 7am to 7pm - Pager - [phone]  After 7pm go to [email or web address] - password [email or web address]  And look for the night coverage person covering for me after hours  [facility] Office  [phone]    Subjective:   Louis Trevino seen and examined.  States he is ready to go home.  States his "sugar and blood pressure" are now under control.  Patient states he does not have a primary care physician or insurance at this time and feels that he cannot stay in the hospital.  Patient concerned about hospital bills.  He states he has not been taking his medication as he should due to not being insured.  Currently he denies chest pain, dizziness, palpitations, abdominal pain, N/V.  Still complains of some SOB.  Objective:   Filed Vitals:   07/26/13 0022 07/26/13 0450 07/26/13 0940 07/26/13 1350  BP: 125/85 140/92 154/101 123/78  Pulse: 102 100 103 113  Temp:  97.7 F (36.5 C)  97.6 F (36.4 C)  TempSrc:  Oral  Oral  Resp: 18 18  18   Height:      Weight:  107.094 kg (236 lb 1.6 oz)    SpO2: 92% 92% 97% 93%    Wt Readings from Last 3 Encounters:  07/26/13 107.094 kg (236 lb 1.6 oz)  07/25/13 107.956 kg (238 lb)     Intake/Output Summary (Last 24 hours) at 07/26/13 1419 Last data filed at 07/26/13 1348  Gross per 24 hour  Intake    483 ml  Output   3825 ml  Net  -3342 ml    Exam General: Awake Alert, Oriented X 3, anxious HEENT: [place]/AT,PERRAL  Neck: Supple Neck,No JVD, No cervical lymphadenopathy appriciated.  Respiratory: Symmetrical Chest wall movement, Good air movement bilaterally,   Cardiovascular: Regular rhythm, tachycardic.  +S1/S2 Abdomen: Soft, nontender, nondistended, positive bowel sounds.  Extremities: +1 Pitting edema in LE B/L. No Cyanosis, Clubbing.  Neuro: Grossly intact, nonfocal.  Data Review   Micro Results No results found for this or any previous visit (from the past 240 hour(s)).  Radiology Reports Dg Chest 2 View  07/25/2013   CLINICAL DATA:  Cough, shortness of Breath for 3 days  EXAM: CHEST  2 VIEW  COMPARISON:  None.  FINDINGS: Cardiomediastinal silhouette is unremarkable. No acute infiltrate or pulmonary edema. Mild degenerative changes mid and lower thoracic spine. Mild perihilar and infrahilar increased bronchial markings suspicious for bronchitic changes.  IMPRESSION: No acute infiltrate or pulmonary edema. Mild perihilar and infrahilar increased bronchial markings suspicious for bronchitic changes.   Electronically Signed   By: Natasha Mead   On: 07/25/2013 12:59   Ct Angio Chest Pe W/cm &/or Wo Cm  07/25/2013   CLINICAL DATA:  Shortness of breath for 3 days  EXAM: CT ANGIOGRAPHY CHEST WITH CONTRAST  TECHNIQUE: Multidetector CT imaging of the chest was performed using the standard protocol during bolus administration of intravenous contrast. Multiplanar CT image reconstructions including MIPs were obtained to evaluate the vascular anatomy.  CONTRAST:  OMNIPAQUE IOHEXOL 350 MG/ML SOLN  COMPARISON:  None.  FINDINGS: Small bilateral stress set there is a small right pleural effusion identified. Mild dependent changes and atelectasis identified within the lower lobes.  Heart size is mildly enlarged. At right peritracheal lymph node measures 1.9 cm, image 32/series 4. There is a AP window lymph node which measures stress set there is a lymph node adjacent to the main pulmonary artery which measures 1.1 cm, image 32/series 4. Sub- carinal lymph node measures 1.2 cm, image 51/series 4.  The main pulmonary artery appears normal. No saddle embolus identified. There is no abnormal filling defects within  the main pulmonary artery or its branches to suggest an acute pulmonary embolus.  No enlarged axillary or supraclavicular lymph nodes. Limited imaging through the upper abdomen demonstrates no acute findings. Review of the visualized osseous structures is significant for mild multilevel spondylosis. No aggressive lytic or sclerotic bone lesions identified.  Review of the MIP images confirms the above findings.  IMPRESSION: 1. No evidence for acute pulmonary embolus  2. Small right pleural effusion and bibasilar atelectasis.  3. Borderline enlarged hilar and mediastinal lymph nodes. This is a nonspecific finding. Differential considerations include reactive adenopathy, metastatic disease, granulomatous inflammation or infection or lymphoma. Consider  followup imaging in 3 months to confirm stability.   Electronically Signed   By: Signa Kell M.D.   On: 07/25/2013 16:46    CBC  Recent Labs Lab 07/25/13 1143 07/25/13 1740 07/26/13 0535  WBC 11.9* 10.7* 10.4  HGB 15.8 14.6 14.6  HCT 43.6 40.9 41.0  PLT 226 219 224  MCV 86.7 86.8 87.0  MCH 31.4 31.0 31.0  MCHC 36.2* 35.7 35.6  RDW 13.7 13.6 13.8    Chemistries   Recent Labs Lab 07/25/13 1143 07/25/13 1740 07/26/13 0535  NA 132*  --  138  K 3.3*  --  2.7*  CL 95*  --  97  CO2 22  --  31  GLUCOSE 449*  --  192*  BUN 21  --  16  CREATININE 1.05 1.25 1.16  CALCIUM 9.0  --  8.8   ------------------------------------------------------------------------------------------------------------------ estimated creatinine clearance is 84 ml/min (by C-G formula based on Cr of 1.16). ------------------------------------------------------------------------------------------------------------------  Recent Labs  07/25/13 1740  HGBA1C 11.9*   ------------------------------------------------------------------------------------------------------------------ No results found for this basename: CHOL, HDL, LDLCALC, TRIG, CHOLHDL, LDLDIRECT,  in  the last 72 hours ------------------------------------------------------------------------------------------------------------------  Recent Labs  07/25/13 1740  TSH 2.748   ------------------------------------------------------------------------------------------------------------------ No results found for this basename: VITAMINB12, FOLATE, FERRITIN, TIBC, IRON, RETICCTPCT,  in the last 72 hours  Coagulation profile No results found for this basename: INR, PROTIME,  in the last 168 hours  No results found for this basename: DDIMER,  in the last 72 hours  Cardiac Enzymes  Recent Labs Lab 07/25/13 1741 07/25/13 2245 07/26/13 0535  TROPONINI <0.30 <0.30 <0.30   ------------------------------------------------------------------------------------------------------------------ No components found with this basename: POCBNP,

## 2013-07-26 NOTE — Progress Notes (Signed)
Patient given IV metoprolol one time dose. Notified oncoming nurse to recheck heart rate and blood pressure in a couple of hours. Patient comfortable and complains of no pain or discomfort. Will continue to monitor to end of shift.

## 2013-07-26 NOTE — Progress Notes (Signed)
*  PRELIMINARY RESULTS* Echocardiogram 2D Echocardiogram has been performed.  Louis Trevino 07/26/2013, 12:37 PM

## 2013-07-26 NOTE — Progress Notes (Signed)
Inpatient Diabetes Program Recommendations  AACE/ADA: New Consensus Statement on Inpatient Glycemic Control (2013)  Target Ranges:  Prepandial:   less than 140 mg/dL      Peak postprandial:   less than 180 mg/dL (1-2 hours)      Critically ill patients:  140 - 180 mg/dL   Reason for Visit: Results for NAWAF, STRANGE (MRN [id number]) as of 07/26/2013 15:07  Ref. Range 07/25/2013 14:30 07/25/2013 17:07 07/25/2013 21:04 07/26/2013 05:33 07/26/2013 10:57  Glucose-Capillary Latest Range: 70-99 mg/dL 409 (H) 811 (H) 914 (H) 209 (H) 258 (H)  Results for CARLEE, TESFAYE (MRN [id number]) as of 07/26/2013 15:07  Ref. Range 07/25/2013 17:40  Hemoglobin A1C Latest Range: <5.7 % 11.9 (H)   Note patient admitted with high CBG's and high A1C.  Patient admits that he has not been taking medications due to inability to get refill from his primary MD.  States he wants a new doctor.  He admits that even when on his pills that his blood sugars are never less than 200 mg/dL.  He has had diabetes for 10 years.  When I asked him about taking insulin he states "I think I can get it down with pills".  Note from hospitalist's note patient is concerned about not having insurance. Consider follow-up with [facility].  Will order case management consult.    It appears that CBG's have been uncontrolled for a while.  Would benefit from the addition of basal insulin Lantus 20 units daily and Novolog meal coverage 4 units tid with meals while in the hospital. Patient admits that he could not tolerate the Metformin 1000 mg bid due to diarrhea and upset stomach.    If patient is opposed to insulin therapy at home, may benefit from the addition of SGLT-2 inhibitor such as Marcelline Deist which is currently being offered at 0$ co-pay for uninsured patients.  Needs close follow-up with MD after hospitalization.     Beryl Meager, RN, BC-ADM Inpatient Diabetes Coordinator Pager [phone]

## 2013-07-26 NOTE — Progress Notes (Signed)
CRITICAL VALUE ALERT  Critical value received: Potassium 2.7  Date of notification:  07/26/2013  Time of notification: 0815  Critical value read back:yes  Nurse who received alert:  Milta Deiters  MD notified (1st page):  Dr. Madelin Rear  (Notified in person)  Time of first page:  n/a  MD notified (2nd page):n/a  Time of second page:n/a  Responding MD:  Dr. Madelin Rear  Time MD responded:  [phone]

## 2013-07-26 NOTE — Progress Notes (Signed)
Notified attending MD via text page of patient's heart rate staying around 115 and diastolic blood pressure 105. Orders given and will carry out as ordered. Patient complains of no headache, dizziness, or pain. Will continue to monitor to end of shift.

## 2013-07-27 DIAGNOSIS — I10 Essential (primary) hypertension: Secondary | ICD-10-CM

## 2013-07-27 DIAGNOSIS — I5020 Unspecified systolic (congestive) heart failure: Secondary | ICD-10-CM | POA: Diagnosis present

## 2013-07-27 LAB — GLUCOSE, CAPILLARY
Glucose-Capillary: 217 mg/dL — ABNORMAL HIGH (ref 70–99)
Glucose-Capillary: 348 mg/dL — ABNORMAL HIGH (ref 70–99)

## 2013-07-27 LAB — BASIC METABOLIC PANEL
BUN: 18 mg/dL (ref 6–23)
Calcium: 8.6 mg/dL (ref 8.4–10.5)
Chloride: 98 mEq/L (ref 96–112)
Creatinine, Ser: 1.19 mg/dL (ref 0.50–1.35)
GFR calc Af Amer: 78 mL/min — ABNORMAL LOW (ref >90)
Potassium: 3 mEq/L — ABNORMAL LOW (ref 3.5–5.1)
Sodium: 139 mEq/L (ref 135–145)

## 2013-07-27 MED ORDER — CARVEDILOL 6.25 MG PO TABS
6.2500 mg | ORAL_TABLET | Freq: Two times a day (BID) | ORAL | Status: DC
  Administered 2013-07-27 – 2013-07-28 (×2): 6.25 mg via ORAL
  Filled 2013-07-27 (×4): qty 1

## 2013-07-27 MED ORDER — POTASSIUM CHLORIDE CRYS ER 20 MEQ PO TBCR
40.0000 meq | EXTENDED_RELEASE_TABLET | Freq: Two times a day (BID) | ORAL | Status: AC
  Administered 2013-07-27 (×2): 40 meq via ORAL
  Filled 2013-07-27: qty 2

## 2013-07-27 MED ORDER — POTASSIUM CHLORIDE CRYS ER 20 MEQ PO TBCR
20.0000 meq | EXTENDED_RELEASE_TABLET | Freq: Two times a day (BID) | ORAL | Status: DC
  Administered 2013-07-27 – 2013-07-28 (×3): 20 meq via ORAL
  Filled 2013-07-27 (×3): qty 1

## 2013-07-27 MED ORDER — POTASSIUM CHLORIDE 20 MEQ PO PACK
40.0000 meq | PACK | Freq: Two times a day (BID) | ORAL | Status: DC
  Filled 2013-07-27 (×2): qty 2

## 2013-07-27 MED ORDER — INSULIN GLARGINE 100 UNIT/ML ~~LOC~~ SOLN
22.0000 [IU] | Freq: Every day | SUBCUTANEOUS | Status: DC
  Administered 2013-07-27: 22:00:00 22 [IU] via SUBCUTANEOUS
  Filled 2013-07-27 (×2): qty 0.22

## 2013-07-27 MED ORDER — INSULIN ASPART 100 UNIT/ML ~~LOC~~ SOLN
0.0000 [IU] | Freq: Every day | SUBCUTANEOUS | Status: DC
  Administered 2013-07-27: 3 [IU] via SUBCUTANEOUS

## 2013-07-27 MED ORDER — INSULIN ASPART 100 UNIT/ML ~~LOC~~ SOLN
5.0000 [IU] | Freq: Three times a day (TID) | SUBCUTANEOUS | Status: DC
  Administered 2013-07-27 – 2013-07-28 (×2): 5 [IU] via SUBCUTANEOUS

## 2013-07-27 NOTE — Progress Notes (Addendum)
[facility]                                                                                Patient Demographics  Louis Trevino, is a 57 y.o. male, DOB - Dec 02, 1955, ZOX:[id number]  Admit date - 07/25/2013   Admitting Physician Dewayne Shorter Levora Dredge, MD  Outpatient Primary MD for the patient is Leo Grosser, MD  LOS - 2   Chief Complaint  Patient presents with  . Shortness of Breath        Assessment & Plan  1. Acute on chronic Systolic CHF, EF 81-19%  Continue diuresis with lasix, SOB has improved.  Cardiology following.  Patient started on norvasc, coreg, and lisinopril.  (Will need sleep study at discharge.)  2. HTN (hypertension), malignant  -Likely secondary to patient not taking his medications. Continue his amlodipine metoprolol Benzapril Lasix. Order hydralazine when necessary.  3. DM (diabetes mellitus)  Hemiglobin A1c found to be 11.9. Currently on lantus and insulin sliding scale.  Had long conversation regarding the possible need for insulin upon discharge.  Also spoke with Diabetes coordinator, may discharge patient with metformin, Farrxiga, and glipizide as this combination is affordable for the patient.    4. Sinus tachycardia  Patient started on coreg, will continue to monitor. His TSH was found to be within normal limits.  5.Noncompliance with medication  Patient has daily has not seen a physician in some time and has not been taking his medications as he should be. He has been counseled on this.   Code Status: Full  Family Communication: With ?wife at bedside.  Disposition Plan: Admitted, will likely discharge tomorrow 07/28/2013.   Procedures Echocardiogram Study Conclusions  - Left ventricle: The cavity size was mildly dilated. Wall thickness was increased in a pattern of moderate to severe LVH. Systolic function was severely reduced. The estimated ejection fraction was in the range of 20% to 25%. Wall motion was normal; there were no  regional wall motion abnormalities. Doppler parameters are consistent with a reversible restrictive pattern, indicative of decreased left ventricular diastolic compliance and/or increased left atrial pressure (grade 3 diastolic dysfunction). - Left atrium: The atrium was moderately dilated.   Consults  Cardiology   DVT Prophylaxis  Heparin 5000 units SQ  Lab Results  Component Value Date   PLT 224 07/26/2013    Medications  Scheduled Meds: . amLODipine  10 mg Oral Daily  . aspirin EC  81 mg Oral Daily  . carvedilol  6.25 mg Oral BID WC  . furosemide  40 mg Intravenous Q8H  . heparin  5,000 Units Subcutaneous Q8H  . insulin aspart  0-15 Units Subcutaneous TID WC  . insulin glargine  15 Units Subcutaneous QHS  . lisinopril  5 mg Oral BID  . potassium chloride  20 mEq Oral BID  . potassium chloride  40 mEq Oral BID  . sodium chloride  3 mL Intravenous Q12H  . sodium chloride  3 mL Intravenous Q12H   Continuous Infusions:  PRN Meds:.sodium chloride, acetaminophen, acetaminophen, albuterol, ALPRAZolam, alum & mag hydroxide-simeth, dextrose, hydrALAZINE, HYDROcodone-acetaminophen, labetalol, ondansetron (ZOFRAN) IV, ondansetron, sodium chloride  Antibiotics    Anti-infectives   None  Time Spent in minutes   30 minutes   Lauriann Milillo D.O. on 07/27/2013 at 12:42 PM  Between 7am to 7pm - Pager - [phone]  After 7pm go to [email or web address] - password [email or web address]  And look for the night coverage person covering for me after hours  [facility] Office  [phone]    Subjective:   Louis Trevino seen and examined. Patient states he is feeling better today and has no complaints.  His shortness of breath is improving.  Currently he denies chest pain, dizziness, palpitations, abdominal pain, N/V.    Objective:   Filed Vitals:   07/26/13 1745 07/26/13 2032 07/27/13 0426 07/27/13 1100  BP: 149/105 155/104 151/103 144/99  Pulse: 115 114 110 106  Temp:   97.9 F (36.6 C) 98.4 F (36.9 C)   TempSrc:  Oral Oral   Resp:  20 18   Height:      Weight:   105.779 kg (233 lb 3.2 oz)   SpO2:  96% 96% 97%    Wt Readings from Last 3 Encounters:  07/27/13 105.779 kg (233 lb 3.2 oz)  07/25/13 107.956 kg (238 lb)     Intake/Output Summary (Last 24 hours) at 07/27/13 1242 Last data filed at 07/27/13 2841  Gross per 24 hour  Intake   1209 ml  Output   4100 ml  Net  -2891 ml    Exam General: Awake Alert, Oriented X 3, anxious HEENT: [place]/AT,PERRAL  Neck: Supple Neck,No JVD, No cervical lymphadenopathy appriciated.  Respiratory: Symmetrical Chest wall movement, Good air movement bilaterally,   Cardiovascular: Regular rhythm, tachycardic. +S1/S2 Abdomen: Soft, nontender, nondistended, positive bowel sounds.  Extremities: +1 Pitting edema in LE B/L. No Cyanosis, Clubbing.  Neuro: Grossly intact, nonfocal.  Data Review   Micro Results No results found for this or any previous visit (from the past 240 hour(s)).  Radiology Reports Dg Chest 2 View  07/25/2013   CLINICAL DATA:  Cough, shortness of Breath for 3 days  EXAM: CHEST  2 VIEW  COMPARISON:  None.  FINDINGS: Cardiomediastinal silhouette is unremarkable. No acute infiltrate or pulmonary edema. Mild degenerative changes mid and lower thoracic spine. Mild perihilar and infrahilar increased bronchial markings suspicious for bronchitic changes.  IMPRESSION: No acute infiltrate or pulmonary edema. Mild perihilar and infrahilar increased bronchial markings suspicious for bronchitic changes.   Electronically Signed   By: Natasha Mead   On: 07/25/2013 12:59   Ct Angio Chest Pe W/cm &/or Wo Cm  07/25/2013   CLINICAL DATA:  Shortness of breath for 3 days  EXAM: CT ANGIOGRAPHY CHEST WITH CONTRAST  TECHNIQUE: Multidetector CT imaging of the chest was performed using the standard protocol during bolus administration of intravenous contrast. Multiplanar CT image reconstructions including MIPs were obtained  to evaluate the vascular anatomy.  CONTRAST:  OMNIPAQUE IOHEXOL 350 MG/ML SOLN  COMPARISON:  None.  FINDINGS: Small bilateral stress set there is a small right pleural effusion identified. Mild dependent changes and atelectasis identified within the lower lobes.  Heart size is mildly enlarged. At right peritracheal lymph node measures 1.9 cm, image 32/series 4. There is a AP window lymph node which measures stress set there is a lymph node adjacent to the main pulmonary artery which measures 1.1 cm, image 32/series 4. Sub- carinal lymph node measures 1.2 cm, image 51/series 4.  The main pulmonary artery appears normal. No saddle embolus identified. There is no abnormal filling defects within the main pulmonary artery or its branches to suggest  an acute pulmonary embolus.  No enlarged axillary or supraclavicular lymph nodes. Limited imaging through the upper abdomen demonstrates no acute findings. Review of the visualized osseous structures is significant for mild multilevel spondylosis. No aggressive lytic or sclerotic bone lesions identified.  Review of the MIP images confirms the above findings.  IMPRESSION: 1. No evidence for acute pulmonary embolus  2. Small right pleural effusion and bibasilar atelectasis.  3. Borderline enlarged hilar and mediastinal lymph nodes. This is a nonspecific finding. Differential considerations include reactive adenopathy, metastatic disease, granulomatous inflammation or infection or lymphoma. Consider followup imaging in 3 months to confirm stability.   Electronically Signed   By: Signa Kell M.D.   On: 07/25/2013 16:46    CBC  Recent Labs Lab 07/25/13 1143 07/25/13 1740 07/26/13 0535  WBC 11.9* 10.7* 10.4  HGB 15.8 14.6 14.6  HCT 43.6 40.9 41.0  PLT 226 219 224  MCV 86.7 86.8 87.0  MCH 31.4 31.0 31.0  MCHC 36.2* 35.7 35.6  RDW 13.7 13.6 13.8    Chemistries   Recent Labs Lab 07/25/13 1143 07/25/13 1740 07/26/13 0535 07/27/13 0500  NA 132*  --   138 139  K 3.3*  --  2.7* 3.0*  CL 95*  --  97 98  CO2 22  --  31 31  GLUCOSE 449*  --  192* 227*  BUN 21  --  16 18  CREATININE 1.05 1.25 1.16 1.19  CALCIUM 9.0  --  8.8 8.6   ------------------------------------------------------------------------------------------------------------------ estimated creatinine clearance is 81.3 ml/min (by C-G formula based on Cr of 1.19). ------------------------------------------------------------------------------------------------------------------  Recent Labs  07/25/13 1740  HGBA1C 11.9*   ------------------------------------------------------------------------------------------------------------------ No results found for this basename: CHOL, HDL, LDLCALC, TRIG, CHOLHDL, LDLDIRECT,  in the last 72 hours ------------------------------------------------------------------------------------------------------------------  Recent Labs  07/25/13 1740  TSH 2.748   ------------------------------------------------------------------------------------------------------------------ No results found for this basename: VITAMINB12, FOLATE, FERRITIN, TIBC, IRON, RETICCTPCT,  in the last 72 hours  Coagulation profile No results found for this basename: INR, PROTIME,  in the last 168 hours  No results found for this basename: DDIMER,  in the last 72 hours  Cardiac Enzymes  Recent Labs Lab 07/25/13 1741 07/25/13 2245 07/26/13 0535  TROPONINI <0.30 <0.30 <0.30   ------------------------------------------------------------------------------------------------------------------ No components found with this basename: POCBNP,

## 2013-07-27 NOTE — Progress Notes (Signed)
Inpatient Diabetes Program Recommendations  AACE/ADA: New Consensus Statement on Inpatient Glycemic Control (2013)  Target Ranges:  Prepandial:   less than 140 mg/dL      Peak postprandial:   less than 180 mg/dL (1-2 hours)      Critically ill patients:  140 - 180 mg/dL   Reason for Visit: Results for ANANTH, FIALLOS (MRN [id number]) as of 07/27/2013 12:09  Ref. Range 07/26/2013 16:07 07/26/2013 21:21 07/27/2013 04:22 07/27/2013 05:22 07/27/2013 11:29  Glucose-Capillary Latest Range: 70-99 mg/dL 629 (H) 528 (H) 413 (H) 217 (H) 348 (H)   Please increase Lantus to 22 units daily.  Also add Novolog meal coverage 5 units tid with meals.  Will follow.  Thanks, Beryl Meager, RN, BC-ADM Inpatient Diabetes Coordinator Pager [phone]

## 2013-07-27 NOTE — Plan of Care (Signed)
Problem: Phase I Progression Outcomes Goal: EF % per last Echo/documented,Core Reminder form on chart Outcome: Completed/Met Date Met:  07/27/13 EF% per last echo on 07/26/13 was 20-25%.

## 2013-07-27 NOTE — Progress Notes (Signed)
Subjective:  Laying flat sleeping, breathing improved. No CP.   Objective:  Vital Signs in the last 24 hours: Temp:  [97.9 F (36.6 C)-98.6 F (37 C)] 98.6 F (37 C) (09/26 1355) Pulse Rate:  [57-115] 57 (09/26 1355) Resp:  [18-20] 18 (09/26 1355) BP: (130-155)/(78-105) 130/78 mmHg (09/26 1355) SpO2:  [90 %-97 %] 90 % (09/26 1355) Weight:  [105.779 kg (233 lb 3.2 oz)] 105.779 kg (233 lb 3.2 oz) (09/26 0426)  Intake/Output from previous day: 09/25 0701 - 09/26 0700 In: 1212 [P.O.:1200; I.V.:12] Out: 3550 [Urine:3550]   Physical Exam: General: Well developed, well nourished, in no acute distress. Head:  Normocephalic and atraumatic. Lungs: Clear to auscultation and percussion. Heart: Normal S1 and S2.  No murmur, rubs or gallops.  Abdomen: soft, non-tender, positive bowel sounds. Extremities: No clubbing or cyanosis. No edema. Neurologic: Alert and oriented x 3.    Lab Results:  Recent Labs  07/25/13 1740 07/26/13 0535  WBC 10.7* 10.4  HGB 14.6 14.6  PLT 219 224    Recent Labs  07/26/13 0535 07/27/13 0500  NA 138 139  K 2.7* 3.0*  CL 97 98  CO2 31 31  GLUCOSE 192* 227*  BUN 16 18  CREATININE 1.16 1.19    Recent Labs  07/25/13 2245 07/26/13 0535  TROPONINI <0.30 <0.30   Hepatic Function Panel No results found for this basename: PROT, ALBUMIN, AST, ALT, ALKPHOS, BILITOT, BILIDIR, IBILI,  in the last 72 hours No results found for this basename: CHOL,  in the last 72 hours No results found for this basename: PROTIME,  in the last 72 hours  Imaging: Ct Angio Chest Pe W/cm &/or Wo Cm  07/25/2013   CLINICAL DATA:  Shortness of breath for 3 days  EXAM: CT ANGIOGRAPHY CHEST WITH CONTRAST  TECHNIQUE: Multidetector CT imaging of the chest was performed using the standard protocol during bolus administration of intravenous contrast. Multiplanar CT image reconstructions including MIPs were obtained to evaluate the vascular anatomy.  CONTRAST:  OMNIPAQUE  IOHEXOL 350 MG/ML SOLN  COMPARISON:  None.  FINDINGS: Small bilateral stress set there is a small right pleural effusion identified. Mild dependent changes and atelectasis identified within the lower lobes.  Heart size is mildly enlarged. At right peritracheal lymph node measures 1.9 cm, image 32/series 4. There is a AP window lymph node which measures stress set there is a lymph node adjacent to the main pulmonary artery which measures 1.1 cm, image 32/series 4. Sub- carinal lymph node measures 1.2 cm, image 51/series 4.  The main pulmonary artery appears normal. No saddle embolus identified. There is no abnormal filling defects within the main pulmonary artery or its branches to suggest an acute pulmonary embolus.  No enlarged axillary or supraclavicular lymph nodes. Limited imaging through the upper abdomen demonstrates no acute findings. Review of the visualized osseous structures is significant for mild multilevel spondylosis. No aggressive lytic or sclerotic bone lesions identified.  Review of the MIP images confirms the above findings.  IMPRESSION: 1. No evidence for acute pulmonary embolus  2. Small right pleural effusion and bibasilar atelectasis.  3. Borderline enlarged hilar and mediastinal lymph nodes. This is a nonspecific finding. Differential considerations include reactive adenopathy, metastatic disease, granulomatous inflammation or infection or lymphoma. Consider followup imaging in 3 months to confirm stability.   Electronically Signed   By: Signa Kell M.D.   On: 07/25/2013 16:46     Telemetry: no adverse arrhythmia  Personally viewed.  Cardiac Studies:  EF 20-25%  Assessment/Plan:  Principal Problem:   Systolic CHF Active Problems:   SOB (shortness of breath)   HTN (hypertension), malignant   DM (diabetes mellitus)   Sinus tachycardia  -continue with further titration of ace-I as tolerated. May not be able to go up on Bb because of resting HR in 50's.  -If needed (if BP low)  I would discontinue amlodipine in order to increase ace-I. -Replete hypokalemia -DM care -Continue with IV lasix - 5L out thus far. -Consider change to oral tomorrow.   -Will need angiogram at some point.  -OSA eval.  Louis Trevino 07/27/2013, 3:19 PM

## 2013-07-27 NOTE — Progress Notes (Signed)
CRITICAL VALUE ALERT  Critical value received: BG 284  Date of notification:  07/27/2013  Time of notification:  2145  Critical value read back:yes  Nurse who received alert: Young Berry, RN  MD notified (1st page): M. Burnadette Peter, MD  Time of first page:  2150  MD notified (2nd page):  Time of second page:  Responding MD:  M. Burnadette Peter, MD  Time MD responded:  2153

## 2013-07-27 NOTE — Progress Notes (Signed)
07/27/13 Pt.A/Ox4, he is ambulatory without assistance. No c/o pain and no signs of distress. Wife stayed at bedside throughout the shift. Mid level provider with [facility] was called and notified of pt.'s hs blood sugar of 390. New orders were written for coverage.

## 2013-07-27 NOTE — Care Management Note (Signed)
    Page 1 of 1   07/27/2013     2:15:37 PM   CARE MANAGEMENT NOTE 07/27/2013  Patient:  Louis Trevino, Louis Trevino   Account Number:  [id number]  Date Initiated:  07/27/2013  Documentation initiated by:  Tera Mater  Subjective/Objective Assessment:   57yo male admitted with SOB.  Pt. lives at home with girlfriend.     Action/Plan:   discharge planning   Anticipated DC Date:  07/28/2013   Anticipated DC Plan:        DC Planning Services  CM consult  MATCH Program      Choice offered to / List presented to:             Status of service:  In process, will continue to follow Medicare Important Message given?   (If response is "NO", the following Medicare IM given date fields will be blank) Date Medicare IM given:   Date Additional Medicare IM given:    Discharge Disposition:    Per UR Regulation:  Reviewed for med. necessity/level of care/duration of stay  If discussed at Long Length of Stay Meetings, dates discussed:    Comments:  07/27/13 1045 In to speak with pt. about obtaining a PCP and medication assistance.  Pt. is agreeable to set up an appointment with the [facility] and [facility]. This NCM TC [phone]) to make an appointment, as well as emailing contact person, Fay Records.  Will let the patient know to call and make appointment.  Pt. is eligible for [facility] program.  Gave letter to pt. and explained to pt. he had to use within 7days of dc, and to only use one of the eligible pharmacies to fill rx.  Pt. may dc home tomorrow with girlfriend. Tera Mater, RN, BSN NCM [phone]

## 2013-07-28 DIAGNOSIS — R000 Tachycardia, unspecified: Secondary | ICD-10-CM

## 2013-07-28 LAB — BASIC METABOLIC PANEL
BUN: 25 mg/dL — ABNORMAL HIGH (ref 6–23)
Calcium: 9.5 mg/dL (ref 8.4–10.5)
Chloride: 94 mEq/L — ABNORMAL LOW (ref 96–112)
GFR calc Af Amer: 62 mL/min — ABNORMAL LOW (ref >90)
GFR calc non Af Amer: 53 mL/min — ABNORMAL LOW (ref >90)
Glucose, Bld: 224 mg/dL — ABNORMAL HIGH (ref 70–99)
Potassium: 3.4 mEq/L — ABNORMAL LOW (ref 3.5–5.1)
Sodium: 135 mEq/L (ref 135–145)

## 2013-07-28 LAB — GLUCOSE, CAPILLARY: Glucose-Capillary: 236 mg/dL — ABNORMAL HIGH (ref 70–99)

## 2013-07-28 MED ORDER — ASPIRIN 81 MG PO TBEC: 81.0000 mg | DELAYED_RELEASE_TABLET | Freq: Every day | ORAL | Status: DC

## 2013-07-28 MED ORDER — GLIPIZIDE 5 MG PO TABS: 5.0000 mg | ORAL_TABLET | Freq: Two times a day (BID) | ORAL | Status: DC

## 2013-07-28 MED ORDER — FUROSEMIDE 40 MG PO TABS: 40.0000 mg | ORAL_TABLET | Freq: Every day | ORAL | Status: DC

## 2013-07-28 MED ORDER — METFORMIN HCL 500 MG PO TABS: 500.0000 mg | ORAL_TABLET | Freq: Two times a day (BID) | ORAL | Status: DC

## 2013-07-28 MED ORDER — DAPAGLIFLOZIN PROPANEDIOL 5 MG PO TABS: 5.0000 mg | ORAL_TABLET | Freq: Every morning | ORAL | Status: DC

## 2013-07-28 MED ORDER — LISINOPRIL 5 MG PO TABS: 5.0000 mg | ORAL_TABLET | Freq: Two times a day (BID) | ORAL | Status: DC

## 2013-07-28 MED ORDER — CARVEDILOL 6.25 MG PO TABS: 6.2500 mg | ORAL_TABLET | Freq: Two times a day (BID) | ORAL | Status: DC

## 2013-07-28 MED ORDER — AMLODIPINE BESYLATE 10 MG PO TABS: 10.0000 mg | ORAL_TABLET | Freq: Every day | ORAL | Status: DC

## 2013-07-28 NOTE — Discharge Summary (Addendum)
Physician Discharge Summary  PARDEEP PAUTZ EAV:[id number] DOB: Nov 21, 1955 DOA: 07/25/2013  PCP: Leo Grosser, MD  Admit date: 07/25/2013 Discharge date: 07/28/2013  Time spent: 45 minutes  Recommendations for Outpatient Follow-up:  Follow up with [facility].  Please call them and set up an appointment for one week.  You will also need to follow up with cardiology as well as have a sleep study done.  Please take all of your medications as prescribed.  Exercise for at least 20 minutes per day as tolerated and follow a low sodium healthy diet with a 1.5L/day fluid restriction.  Discharge Diagnoses:  Principal Problem:   Acute on chronic Systolic CHF Active Problems:   SOB (shortness of breath)   HTN (hypertension), malignant   DM (diabetes mellitus)   Sinus tachycardia   Discharge Condition: Stable Diet recommendation: Heart healthy, low sodium, 1.5L/day fluid restriction  Filed Weights   07/26/13 0450 07/27/13 0426 07/28/13 0505  Weight: 107.094 kg (236 lb 1.6 oz) 105.779 kg (233 lb 3.2 oz) 105.28 kg (232 lb 1.6 oz)    History of present illness:  Louis Trevino is a 57 y.o. male with a Past Medical History of hypertension, diabetes, noncompliant with medications who presents today with the above noted complaint. Per patient, approximately 3 days ago he noted that he was short of breath from walking from his mailbox to his house. He also noted that he had shortness of breath upon lying flat. Shortness of breath is only present during exertion and on lying flat, it is not otherwise present. There is some associated mild swelling of his legs. There is no cough. He was seen at his primary care practitioner's office today, was found to have uncontrolled hypertension and uncontrolled diabetes, and was sent to the emergency room for further evaluation. In the emergency room, he was found to be tachycardic, initial O2 saturation was in the 80s. A chest x-ray did not show any overt  pulmonary edema, patient was given Lasix and has felt better since then. I was subsequently asked to admit this patient for further evaluation and treatment.  During my evaluation, patient seemed comfortable, he was not short of breath. He denied any chest pain, nausea, vomiting or diarrhea. He denied any abdominal pain as well. CT angiogram of the chest has been ordered by the emergency room physician, and is currently pending.   Hospital Course:  This is a 57 year old male with a history of hypertension and diabetes as well as noncompliance with medications. He presented to the emergency department with complaints shortness of breath particularly made worse with lying down as well as swelling in the legs. Patient states that he had not been to see a physician since he moved here from [place]. Therefore he did not have any medications and ran out of them.  Patient also stated he had no insurance.Upon arrival to emergency department patient was found to be short of breath as well as tachycardic. A CT of the chest was conducted to rule out PE. PE was ruled out. However patient did have an elevated BNP. At that point echocardiogram was ordered. Results are listed below. Cardiology was in consult at. Patient was seen by Dr. Katrinka Blazing. He also recommended an outpatient sleep evaluation to rule out obstructive sleep apnea. Patient was placed on Lasix and diuresed well during his stay. He will be discharged with Lasix as well as lisinopril, carvedilol, amlodipine.  Case workers as well as Chief Executive Officer workers did work with the patient  regarding his insurance status. Medic care Medicaid paperwork has been started. Patient has also been set up with the [facility]. He is also been given a card for his medications. Diabetes counseling was conducted., The patient did have a hemoglobin A1c 11.9. It was felt that it would be more of durable for him to take his metformin, glipizide, and [place].  He can receive [place]  essentially for free with a card was also given to him. It was felt that this medication regimen would be very economically portable for the patient. Patient was also counseled on dietary habits as well as exercise.    Again the patient was instructed [facility] as well as to fill his medications and take them appropriately and to followup with cardiology.  Procedures: Echocardiogram 07/26/13 Study Conclusions  - Left ventricle: The cavity size was mildly dilated. Wall thickness was increased in a pattern of moderate to severe LVH. Systolic function was severely reduced. The estimated ejection fraction was in the range of 20% to 25%. Wall motion was normal; there were no regional wall motion abnormalities. Doppler parameters are consistent with a reversible restrictive pattern, indicative of decreased left ventricular diastolic compliance and/or increased left atrial pressure (grade 3 diastolic dysfunction). - Left atrium: The atrium was moderately dilated.   Consultations:  Cardiology  Discharge Exam: Filed Vitals:   07/28/13 0925  BP: 117/80  Pulse:   Temp:   Resp:     General:Well developed, well nouriand and and shedand and , NAD HEENT: [place]/AT,PERRAL  Neck: Supple Neck,No JVD, No cervical lymphadenopathy appriciated.  Respiratory: Symmetrical Chest wall movement, Good air movement bilaterally,  Cardiovascular: Regular rhythm, tachycardic. +S1/S2  Abdomen: Soft, nontender, nondistended, positive bowel sounds.  Extremities: No Cyanosis, Clubbing, or edema. Neuro: Grossly intact, nonfocal. Alert and oriented x3  Discharge Instructions  Discharge Orders   Future Orders Complete By Expires   (HEART FAILURE PATIENTS) Call MD:  Anytime you have any of the following symptoms: 1) 3 pound weight gain in 24 hours or 5 pounds in 1 week 2) shortness of breath, with or without a dry hacking cough 3) swelling in the hands, feet or stomach 4) if  you have to sleep on extra pillows at night in order to breathe.  As directed    Diet - low sodium heart healthy  As directed    Scheduling Instructions:     1.5L/day fluid restriction   Discharge instructions  As directed    Comments:     Follow up with [facility].  Please call them and set up an appointment for one week.  You will also need to follow up with cardiology as well as have a sleep study done.  Please take all of your medications as prescribed.  Exercise for at least 20 minutes per day as tolerated and follow a low sodium healthy diet with a 1.5L/day fluid restriction.   Increase activity slowly  As directed        Medication List    STOP taking these medications       benazepril 40 MG tablet  Commonly known as:  LOTENSIN     doxazosin 8 MG tablet  Commonly known as:  CARDURA     metoprolol 100 MG tablet  Commonly known as:  LOPRESSOR      TAKE these medications       ALPRAZolam 0.5 MG tablet  Commonly known as:  XANAX  Take 0.5  mg by mouth daily as needed for anxiety.     amLODipine 10 MG tablet  Commonly known as:  NORVASC  Take 1 tablet (10 mg total) by mouth daily.     aspirin 81 MG EC tablet  Take 1 tablet (81 mg total) by mouth daily.     carvedilol 6.25 MG tablet  Commonly known as:  COREG  Take 1 tablet (6.25 mg total) by mouth 2 (two) times daily with a meal.     Dapagliflozin Propanediol 5 MG Tabs  Commonly known as:  FARXIGA  Take 5 mg by mouth every morning.     glipiZIDE 5 MG tablet  Commonly known as:  GLUCOTROL  Take 1 tablet (5 mg total) by mouth 2 (two) times daily before a meal.     lisinopril 5 MG tablet  Commonly known as:  PRINIVIL,ZESTRIL  Take 1 tablet (5 mg total) by mouth 2 (two) times daily at 10 AM and 5 PM.     metFORMIN 500 MG tablet  Commonly known as:  GLUCOPHAGE  Take 1 tablet (500 mg total) by mouth 2 (two) times daily with a meal.     naproxen sodium 220 MG tablet  Commonly known as:  ANAPROX  Take 440 mg by  mouth daily as needed (for pain).       No Known Allergies     Follow-up Information   Follow up with [facility]    . (Call to make appointment for hospital follow up within a 7 days. )    Contact information:   [address] [place] [address]       Follow up with Lesleigh Noe, MD. Schedule an appointment as soon as possible for a visit in 2 weeks.   Specialty:  Cardiology   Contact information:   [address] [place] [address] [phone]        The results of significant diagnostics from this hospitalization (including imaging, microbiology, ancillary and laboratory) are listed below for reference.    Significant Diagnostic Studies: Dg Chest 2 View  07/25/2013   CLINICAL DATA:  Cough, shortness of Breath for 3 days  EXAM: CHEST  2 VIEW  COMPARISON:  None.  FINDINGS: Cardiomediastinal silhouette is unremarkable. No acute infiltrate or pulmonary edema. Mild degenerative changes mid and lower thoracic spine. Mild perihilar and infrahilar increased bronchial markings suspicious for bronchitic changes.  IMPRESSION: No acute infiltrate or pulmonary edema. Mild perihilar and infrahilar increased bronchial markings suspicious for bronchitic changes.   Electronically Signed   By: Natasha Mead   On: 07/25/2013 12:59   Ct Angio Chest Pe W/cm &/or Wo Cm  07/25/2013   CLINICAL DATA:  Shortness of breath for 3 days  EXAM: CT ANGIOGRAPHY CHEST WITH CONTRAST  TECHNIQUE: Multidetector CT imaging of the chest was performed using the standard protocol during bolus administration of intravenous contrast. Multiplanar CT image reconstructions including MIPs were obtained to evaluate the vascular anatomy.  CONTRAST:  OMNIPAQUE IOHEXOL 350 MG/ML SOLN  COMPARISON:  None.  FINDINGS: Small bilateral stress set there is a small right pleural effusion identified. Mild dependent changes and atelectasis identified within the lower lobes.   Heart size is mildly enlarged. At right peritracheal lymph node measures 1.9 cm, image 32/series 4. There is a AP window lymph node which measures stress set there is a lymph node adjacent to the main pulmonary artery which measures 1.1 cm, image 32/series 4.  Sub- carinal lymph node measures 1.2 cm, image 51/series 4.  The main pulmonary artery appears normal. No saddle embolus identified. There is no abnormal filling defects within the main pulmonary artery or its branches to suggest an acute pulmonary embolus.  No enlarged axillary or supraclavicular lymph nodes. Limited imaging through the upper abdomen demonstrates no acute findings. Review of the visualized osseous structures is significant for mild multilevel spondylosis. No aggressive lytic or sclerotic bone lesions identified.  Review of the MIP images confirms the above findings.  IMPRESSION: 1. No evidence for acute pulmonary embolus  2. Small right pleural effusion and bibasilar atelectasis.  3. Borderline enlarged hilar and mediastinal lymph nodes. This is a nonspecific finding. Differential considerations include reactive adenopathy, metastatic disease, granulomatous inflammation or infection or lymphoma. Consider followup imaging in 3 months to confirm stability.   Electronically Signed   By: Signa Kell M.D.   On: 07/25/2013 16:46    Microbiology: No results found for this or any previous visit (from the past 240 hour(s)).   Labs: Basic Metabolic Panel:  Recent Labs Lab 07/25/13 1143 07/25/13 1740 07/26/13 0535 07/27/13 0500 07/28/13 0500  NA 132*  --  138 139 135  K 3.3*  --  2.7* 3.0* 3.4*  CL 95*  --  97 98 94*  CO2 22  --  31 31 27   GLUCOSE 449*  --  192* 227* 224*  BUN 21  --  16 18 25*  CREATININE 1.05 1.25 1.16 1.19 1.44*  CALCIUM 9.0  --  8.8 8.6 9.5   Liver Function Tests: No results found for this basename: AST, ALT, ALKPHOS, BILITOT, PROT, ALBUMIN,  in the last 168 hours No results found for this basename:  LIPASE, AMYLASE,  in the last 168 hours No results found for this basename: AMMONIA,  in the last 168 hours CBC:  Recent Labs Lab 07/25/13 1143 07/25/13 1740 07/26/13 0535  WBC 11.9* 10.7* 10.4  HGB 15.8 14.6 14.6  HCT 43.6 40.9 41.0  MCV 86.7 86.8 87.0  PLT 226 219 224   Cardiac Enzymes:  Recent Labs Lab 07/25/13 1741 07/25/13 2245 07/26/13 0535  TROPONINI <0.30 <0.30 <0.30   BNP: BNP (last 3 results)  Recent Labs  07/25/13 1143  PROBNP 1221.0*   CBG:  Recent Labs Lab 07/27/13 0522 07/27/13 1129 07/27/13 1618 07/27/13 2136 07/28/13 0646  GLUCAP 217* 348* 208* 284* 236*       Signed:  Marrell Dicaprio  [facility] 07/28/2013, 10:08 AM

## 2013-07-28 NOTE — Progress Notes (Signed)
Pt given discharge instruction, information explained, questions answered, refused to discharged by wheelchair, ambulated with significant other off the unit, Match card given to patient by CM, will continue to monitor

## 2013-07-28 NOTE — Progress Notes (Signed)
Subjective:  Not short of breath looking forward to going home  Objective:  Vital Signs in the last 24 hours: BP 117/80  Pulse 107  Temp(Src) 97.5 F (36.4 C) (Oral)  Resp 18  Ht 5\' 7"  (1.702 m)  Wt 105.28 kg (232 lb 1.6 oz)  BMI 36.34 kg/m2  SpO2 100%  Physical Exam: Obese white male in no acute distress Lungs:  Clear  Cardiac:  Regular rhythm, normal S1 and S2, no S3 Abdomen:  Soft, nontender, no masses Extremities:  Trace edema noted  Intake/Output from previous day: 09/26 0701 - 09/27 0700 In: 1220 [P.O.:1220] Out: 3625 [Urine:3625] Weight Filed Weights   07/26/13 0450 07/27/13 0426 07/28/13 0505  Weight: 107.094 kg (236 lb 1.6 oz) 105.779 kg (233 lb 3.2 oz) 105.28 kg (232 lb 1.6 oz)    Lab Results: Basic Metabolic Panel:  Recent Labs  98/11/91 0500 07/28/13 0500  NA 139 135  K 3.0* 3.4*  CL 98 94*  CO2 31 27  GLUCOSE 227* 224*  BUN 18 25*  CREATININE 1.19 1.44*    CBC:  Recent Labs  07/25/13 1740 07/26/13 0535  WBC 10.7* 10.4  HGB 14.6 14.6  HCT 40.9 41.0  MCV 86.8 87.0  PLT 219 224    BNP    Component Value Date/Time   PROBNP 1221.0* 07/25/2013 1143   Telemetry: Sinus rhythm  Assessment/Plan:  1. Acute on chronic systolic heart failure that is improved  Recommendations:  Followup with Dr. Anne Fu as an outpatient also. Importance of compliance discussed with patient     W. Ashley Royalty  MD [facility]  07/28/2013, 10:56 AM

## 2013-07-31 NOTE — Clinical Documentation Improvement (Signed)
THIS DOCUMENT IS NOT A PERMANENT PART OF THE MEDICAL RECORD  Please update your documentation with the medical record to reflect your response to this query. If you need help knowing how to do this please call [phone].  07/31/13  Dear [facility]. [facility],  In a better effort to capture your patient's severity of illness, reflect appropriate length of stay and utilization of resources, a review of the patient medical record has revealed the following indicators the diagnosis of Heart Failure.  Based on your clinical judgment, please clarify and document CHF TYPE & ACUITY in a progress note and/or discharge summary the clinical condition associated with the following supporting information:  In responding to this query please exercise your independent judgment. The fact that a query is asked, does not imply that any particular answer is desired or expected.   Possible Clinical Conditions?    Acute Systolic Congestive Heart Failure  Acute on Chronic Systolic Congestive Heart Failure    Chronic Systolic Congestive Heart Failure   Other Condition   Cannot Clinically Determine   Supporting Information:  Risk Factors:(As per notes) " CHF"  Signs & Symptoms:(As per notes)"- Symptoms, and some clinical signs are suggestive of acute congestive heart failure"   Diagnostics:  CXR=07/25/13  IMPRESSION: No acute infiltrate or pulmonary edema. Mild perihilar and infrahilar increased bronchial markings suspicious for bronchitic changes.  CT/ANGIO CHEST=07/25/13  IMPRESSION: 1. No evidence for acute pulmonary embolus 2. Small right pleural effusion and bibasilar atelectasis. 3. Borderline enlarged hilar and mediastinal lymph nodes. This is a nonspecific finding. Differential considerations include reactive adenopathy, metastatic disease, granulomatous inflammation or infection or lymphoma. Consider followup imaging in 3 months to confirm stability.   Treatment:(As per notes)start Lasix,  continue with beta blockers and ACE inhibitor    Thank You,  Nevin Bloodgood RN, BSN, CCDS Clinical Documentation Specialist:  [phone]  Cell=[phone]  Health Information Management  [place]

## 2013-08-09 ENCOUNTER — Ambulatory Visit: Payer: Self-pay | Attending: Internal Medicine | Admitting: Internal Medicine

## 2013-08-09 VITALS — BP 160/110 | HR 74 | Temp 98.2°F | Resp 16 | Ht 67.0 in | Wt 237.7 lb

## 2013-08-09 DIAGNOSIS — E1110 Type 2 diabetes mellitus with ketoacidosis without coma: Secondary | ICD-10-CM

## 2013-08-09 DIAGNOSIS — E1310 Other specified diabetes mellitus with ketoacidosis without coma: Secondary | ICD-10-CM

## 2013-08-09 DIAGNOSIS — E119 Type 2 diabetes mellitus without complications: Secondary | ICD-10-CM | POA: Insufficient documentation

## 2013-08-09 DIAGNOSIS — I509 Heart failure, unspecified: Secondary | ICD-10-CM | POA: Insufficient documentation

## 2013-08-09 DIAGNOSIS — I10 Essential (primary) hypertension: Secondary | ICD-10-CM | POA: Insufficient documentation

## 2013-08-09 DIAGNOSIS — I5020 Unspecified systolic (congestive) heart failure: Secondary | ICD-10-CM | POA: Insufficient documentation

## 2013-08-09 LAB — BASIC METABOLIC PANEL
BUN: 19 mg/dL (ref 6–23)
CO2: 33 mEq/L — ABNORMAL HIGH (ref 19–32)
Chloride: 99 mEq/L (ref 96–112)
Creat: 1.38 mg/dL — ABNORMAL HIGH (ref 0.50–1.35)
Potassium: 3.9 mEq/L (ref 3.5–5.3)
Sodium: 138 mEq/L (ref 135–145)

## 2013-08-09 LAB — GLUCOSE, POCT (MANUAL RESULT ENTRY): POC Glucose: 158 mg/dl — AB (ref 70–99)

## 2013-08-09 MED ORDER — METFORMIN HCL 500 MG PO TABS: 500.0000 mg | ORAL_TABLET | Freq: Two times a day (BID) | ORAL | Status: DC

## 2013-08-09 MED ORDER — LISINOPRIL 5 MG PO TABS: 5.0000 mg | ORAL_TABLET | Freq: Two times a day (BID) | ORAL | Status: DC

## 2013-08-09 MED ORDER — DAPAGLIFLOZIN PROPANEDIOL 5 MG PO TABS: 5.0000 mg | ORAL_TABLET | Freq: Every morning | ORAL | Status: DC

## 2013-08-09 MED ORDER — NAPROXEN SODIUM 220 MG PO TABS: 440.0000 mg | ORAL_TABLET | Freq: Every day | ORAL | Status: AC | PRN

## 2013-08-09 MED ORDER — CARVEDILOL 6.25 MG PO TABS: 6.2500 mg | ORAL_TABLET | Freq: Two times a day (BID) | ORAL | Status: DC

## 2013-08-09 MED ORDER — GLIPIZIDE 5 MG PO TABS: 5.0000 mg | ORAL_TABLET | Freq: Two times a day (BID) | ORAL | Status: DC

## 2013-08-09 MED ORDER — AMLODIPINE BESYLATE 10 MG PO TABS: 10.0000 mg | ORAL_TABLET | Freq: Every day | ORAL | Status: DC

## 2013-08-09 MED ORDER — FUROSEMIDE 40 MG PO TABS: 40.0000 mg | ORAL_TABLET | Freq: Every day | ORAL | Status: DC

## 2013-08-09 MED ORDER — ALPRAZOLAM 0.5 MG PO TABS: 0.5000 mg | ORAL_TABLET | Freq: Every day | ORAL | Status: DC | PRN

## 2013-08-09 NOTE — Progress Notes (Signed)
Patient ID: Louis Trevino, male   DOB: 1956-10-09, 57 y.o.   MRN: [id number]   CC:  Regular followup  HPI: Patient is 57 year old male with history of hypertension, diabetes, congestive heart failure, recently hospitalized for congestive heart failure and was started on Lasix. He was advised to [facility] and [facility]. He reports feeling better, denies shortness of breath or chest pain at this time, no weight gain. He reports compliance with medicines and will need refills as he was not given any refills while in the hospital.  No Known Allergies Past Medical History  Diagnosis Date  . Diabetes mellitus   . Hypertension   . CHF (congestive heart failure)    Current Outpatient Prescriptions on File Prior to Visit  Medication Sig Dispense Refill  . aspirin EC 81 MG EC tablet Take 1 tablet (81 mg total) by mouth daily.  30 tablet  0   No current facility-administered medications on file prior to visit.   Family History  Problem Relation Age of Onset  . Heart disease Mother   . Stroke Mother   . Kidney failure Maternal Grandfather   . Stroke Paternal Grandmother    History   Social History  . Marital Status: Widowed    Spouse Name: N/A    Number of Children: N/A  . Years of Education: N/A   Occupational History  . Not on file.   Social History Main Topics  . Smoking status: Never Smoker   . Smokeless tobacco: Not on file  . Alcohol Use: Yes  . Drug Use: No  . Sexual Activity: Not on file   Other Topics Concern  . Not on file   Social History Narrative  . No narrative on file    Review of Systems  Constitutional: Negative for fever, chills, diaphoresis, activity change, appetite change and fatigue.  HENT: Negative for ear pain, nosebleeds, congestion, facial swelling, rhinorrhea, neck pain, neck stiffness and ear discharge.   Eyes: Negative for pain, discharge, redness, itching and visual disturbance.  Respiratory:  Negative for cough, choking, chest tightness, shortness of breath, wheezing and stridor.   Cardiovascular: Negative for chest pain, palpitations and leg swelling.  Gastrointestinal: Negative for abdominal distention.  Genitourinary: Negative for dysuria, urgency, frequency, hematuria, flank pain, decreased urine volume, difficulty urinating and dyspareunia.  Musculoskeletal: Negative for back pain, joint swelling, arthralgias and gait problem.  Neurological: Negative for dizziness, tremors, seizures, syncope, facial asymmetry, speech difficulty, weakness, light-headedness, numbness and headaches.  Hematological: Negative for adenopathy. Does not bruise/bleed easily.  Psychiatric/Behavioral: Negative for hallucinations, behavioral problems, confusion, dysphoric mood, decreased concentration and agitation.    Objective:   Filed Vitals:   08/09/13 0942  BP: 160/110  Pulse: 74  Temp: 98.2 F (36.8 C)  Resp: 16    Physical Exam  Constitutional: Appears well-developed and well-nourished. No distress.  HENT: Normocephalic. External right and left ear normal. Oropharynx is clear and moist.  Eyes: Conjunctivae and EOM are normal. PERRLA, no scleral icterus.  Neck: Normal ROM. Neck supple. No JVD. No tracheal deviation. No thyromegaly.  CVS: RRR, S1/S2 +, no murmurs, no gallops, no carotid bruit.  Pulmonary: Effort and breath sounds normal, no stridor, rhonchi, wheezes, rales.  Abdominal: Soft. BS +,  no distension, tenderness, rebound or guarding.  Musculoskeletal: Normal range of motion. No edema and no tenderness.  Lymphadenopathy: No lymphadenopathy noted, cervical, inguinal. Neuro: Alert. Normal reflexes, muscle tone coordination. No cranial nerve deficit. Skin: Skin  is warm and dry. No rash noted. Not diaphoretic. No erythema. No pallor.  Psychiatric: Normal mood and affect. Behavior, judgment, thought content normal.   Lab Results  Component Value Date   WBC 10.4 07/26/2013   HGB  14.6 07/26/2013   HCT 41.0 07/26/2013   MCV 87.0 07/26/2013   PLT 224 07/26/2013   Lab Results  Component Value Date   CREATININE 1.44* 07/28/2013   BUN 25* 07/28/2013   NA 135 07/28/2013   K 3.4* 07/28/2013   CL 94* 07/28/2013   CO2 27 07/28/2013    Lab Results  Component Value Date   HGBA1C 11.9* 07/25/2013   Lipid Panel  No results found for this basename: chol, trig, hdl, cholhdl, vldl, ldlcalc       Assessment and plan:   Patient Active Problem List   Diagnosis Date Noted  . Systolic CHF - appears to be clinically compensated, patient is stable and maintaining vitals at target range. I have discussed diagnosis of systolic CHF and patient verbalized understanding. We have also discussed Lasix, side effects, duration of action and importance of compliance. Patient verbalized understanding and explains that he is checking his weight regularly. I have advised to bring her logbook of the daily weights that he is recording. I have explained to him if the weight off more than 3 pounds is noted over one to 2 days he needs to call us back so that we can reevaluate and readjust the dose of Lasix as indicated. We will check electrolyte panel today to insure that potassium and kidney function remains stable.  07/27/2013  . SOB (shortness of breath) - secondary to systolic CHF, now resolved and clinically stable  07/25/2013  . Essential hypertension, benign - blood pressure is still elevated and above target range. We have discussed target range and I have advised patient to check his blood pressure regularly and to write the numbers down and call us back if the numbers are higher than 140/90. If the numbers are persistently higher than 140/90 patient was advised we will readjust her regimen as indicated  07/25/2013  . DM (diabetes mellitus) - A1c checked 07/25/2013 and greater than 11. I have discussed in detail meaning of A1c and importance of regular compliance with diabetic medications. We'll provide  samples and the clinic today and I have advised patient to come back in 3 months for check. We have discussed importance of yearly ophthalmology examinations and I will provide referral to ophthalmologist. I have explained importance of daily feet examination to avoid wounds. 07/25/2013

## 2013-08-09 NOTE — Patient Instructions (Signed)
Heart Failure  Heart failure means your heart has trouble pumping blood. This makes it hard for your body to work well. Heart failure is a long-term (chronic) condition. You must take good care of yourself and follow your doctor's treatment plan.  HOME CARE   Take your heart medicine as told by your doctor.   Do not stop taking medicine unless your doctor tells you to.   Do not skip any dose of medicine.   Refill your medicines before they run out.   Only take other medicines after your doctor approves.   Stay active and follow your activity program as told by your doctor.   Rest for 1 hour before and after meals.   Eat heart healthy foods. This includes fresh or frozen fruits and vegetables, fish, lean meats, fat-free or low-fat dairy foods, whole grains, and high-fiber foods.   Do not eat more than 1500 milligrams of salt each day or as told by your doctor.   Cook in a healthy way. Roast, grill, broil, bake, poach, steam, or stir-fry foods.   Limit fluids as told by your doctor.   Weigh yourself every morning. Do this after you pee (urinate) and before you eat breakfast. Write down your weight to give to your doctor.   Take your blood pressure and write it down if your doctor tell you to.   Ask your doctor how to check your pulse. Check your pulse as told.   Lose weight if you are overweight. Maintain a healthy weight.   Stop smoking or chewing tobacco. Do not use gum or patches that help you quit without your doctor's approval.   Schedule and go to doctor visits as told.   Nonpregnant women should have no more than 1 drink a day. Men should have no more than 2 drinks a day. Talk to your doctor about drinking alcohol.   Stop drug use.   Stay current with shots (immunizations).   Manage your health conditions as told by your doctor.   Learn to manage your stress.   Rest when you are tired.   If it is really hot outside:   Avoid intense activities.    Use air conditioning or fans, or get in a cooler place.   Avoid caffeine and alcohol.   Wear loose-fitting, lightweight, and light-colored clothing.   If it is really cold outside:   Avoid intense activities.   Layer your clothing.   Wear mittens or gloves, a hat, and a scarf when going outside.   Avoid alcohol.   Learn about heart failure and get support as needed.   Get help to maintain or improve your quality of life and your ability to care for yourself as needed.  GET HELP IF:    You gain 3 lb/1.4 kg or more in 1 day or 5 lb/2.3 kg in a week.   You are more short of breath than usual.   You cannot do your normal activities.   You tire easily.   You cough more than normal, especially with activity.   You have any or more puffiness (swelling) in areas such as your hands, feet, ankles, or belly (abdomen).   You cannot sleep because it is hard to breathe.   You cough up thick spit (mucus) that is bloody.   You feel like your heart is beating fast (palpitations).   You get dizzy or lightheaded when you stand up.  GET HELP RIGHT AWAY IF:    You have   trouble breathing.   There is a change in mental status, such as becoming less alert or not being able to focus.   You have chest pain or discomfort.   You faint.   You were outside in hot weather and show signs of your body overheating (heat exhaustion):   Heavy sweating.   Muscle cramps.   Weakness.   Dizziness.   Headaches.   Fainting.   You were outside in cold weather and show signs of low body temperature (hypothermia):   Clumsiness.   Confusion.   Sleepiness.   Shivering.  MAKE SURE YOU:    Understand these instructions.   Will watch your condition.   Will get help right away if you are not doing well or get worse.  Document Released: 07/27/2008 Document Revised: 10/04/2012 Document Reviewed: 05/18/2012  ExitCare Patient Information 2014 [employer].

## 2013-08-09 NOTE — Progress Notes (Signed)
Patient is a hospital follow up-DX CHF Needs medication refills as well

## 2013-08-17 ENCOUNTER — Ambulatory Visit: Payer: 59 | Attending: Internal Medicine

## 2013-08-17 DIAGNOSIS — J209 Acute bronchitis, unspecified: Secondary | ICD-10-CM

## 2013-09-06 ENCOUNTER — Other Ambulatory Visit: Payer: Self-pay

## 2013-09-14 ENCOUNTER — Other Ambulatory Visit: Payer: Self-pay | Admitting: Emergency Medicine

## 2013-09-14 MED ORDER — ALPRAZOLAM 0.5 MG PO TABS: 0.5000 mg | ORAL_TABLET | Freq: Every day | ORAL | Status: DC | PRN

## 2013-11-09 ENCOUNTER — Ambulatory Visit: Payer: Self-pay | Admitting: Internal Medicine

## 2013-11-20 ENCOUNTER — Ambulatory Visit: Payer: 59 | Attending: Internal Medicine

## 2013-11-20 VITALS — BP 173/111 | HR 109 | Temp 98.8°F | Resp 14

## 2013-11-20 DIAGNOSIS — Z0000 Encounter for general adult medical examination without abnormal findings: Secondary | ICD-10-CM

## 2013-11-20 LAB — CBC WITH DIFFERENTIAL/PLATELET
BASOS ABS: 0 10*3/uL (ref 0.0–0.1)
Basophils Relative: 0 % (ref 0–1)
Eosinophils Absolute: 0.2 10*3/uL (ref 0.0–0.7)
Eosinophils Relative: 3 % (ref 0–5)
HCT: 42.5 % (ref 39.0–52.0)
Hemoglobin: 14.7 g/dL (ref 13.0–17.0)
Lymphocytes Relative: 16 % (ref 12–46)
Lymphs Abs: 1.2 10*3/uL (ref 0.7–4.0)
MCH: 30.9 pg (ref 26.0–34.0)
MCHC: 34.6 g/dL (ref 30.0–36.0)
MCV: 89.3 fL (ref 78.0–100.0)
Monocytes Absolute: 1.1 10*3/uL — ABNORMAL HIGH (ref 0.1–1.0)
Monocytes Relative: 14 % — ABNORMAL HIGH (ref 3–12)
NEUTROS PCT: 67 % (ref 43–77)
Neutro Abs: 5.2 10*3/uL (ref 1.7–7.7)
Platelets: 209 10*3/uL (ref 150–400)
RBC: 4.76 MIL/uL (ref 4.22–5.81)
RDW: 14.5 % (ref 11.5–15.5)
WBC: 7.7 10*3/uL (ref 4.0–10.5)

## 2013-11-20 MED ORDER — AZITHROMYCIN 500 MG PO TABS: 500.0000 mg | ORAL_TABLET | Freq: Every day | ORAL | Status: AC

## 2013-11-20 MED ORDER — GUAIFENESIN ER 600 MG PO TB12: 600.0000 mg | ORAL_TABLET | Freq: Two times a day (BID) | ORAL | Status: AC

## 2013-11-20 NOTE — Progress Notes (Unsigned)
Pt is a walk in today. Pt reports that he has had a fever and a dry cough since Sunday.

## 2013-11-20 NOTE — Patient Instructions (Signed)
Pt was ordered CBC and flu panel. We prescribed azithromycin and mucinex.

## 2013-11-28 ENCOUNTER — Telehealth: Payer: Self-pay | Admitting: *Deleted

## 2013-11-28 NOTE — Telephone Encounter (Signed)
Message copied by Joannah Gitlin, [place] on Wed Nov 28, 2013  3:21 PM ------      Message from: Susie CassetteABROL MD, [facility]      Created: Wed Nov 21, 2013  4:27 PM       Notify patient if CBC is normal ------

## 2013-11-28 NOTE — Telephone Encounter (Signed)
First call: Pt's voice mailbox was not set up. Unable to leave pt a voicemail. Second call: Left a voicemail on pt's home number for pt to give us a call back.

## 2013-12-17 ENCOUNTER — Ambulatory Visit: Payer: Self-pay | Admitting: Internal Medicine

## 2013-12-24 ENCOUNTER — Ambulatory Visit: Payer: Self-pay | Admitting: Internal Medicine

## 2014-02-18 ENCOUNTER — Ambulatory Visit: Payer: Self-pay | Admitting: Internal Medicine

## 2014-05-07 IMAGING — CT CT ANGIO CHEST
2 of 9 series · 18 of 46 positions shown · IV contrast (APPLIED)
Comparison: None.

CLINICAL DATA: Shortness of breath for 3 days

EXAM:
CT ANGIOGRAPHY CHEST WITH CONTRAST
TECHNIQUE: Multidetector CT imaging of the chest was performed using the
standard protocol during bolus administration of intravenous
contrast. Multiplanar CT image reconstructions including MIPs were
obtained to evaluate the vascular anatomy.
CONTRAST:  100mL OMNIPAQUE IOHEXOL 350 MG/ML SOLN

[Series 5: thins · axial · 0.82mm/px · z∈[+1298,+1532]mm · 16 of 263 slices shown]
[im 15/263  lung]
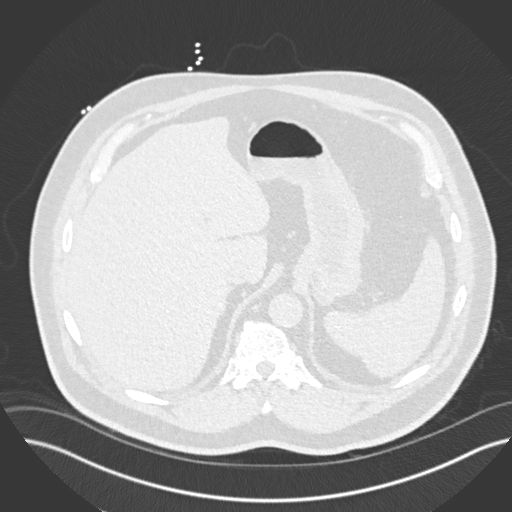
[im 30/263  soft-tissue]
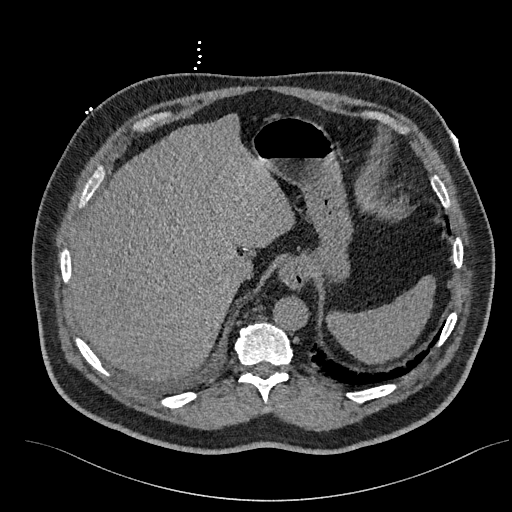
[im 44/263  lung]
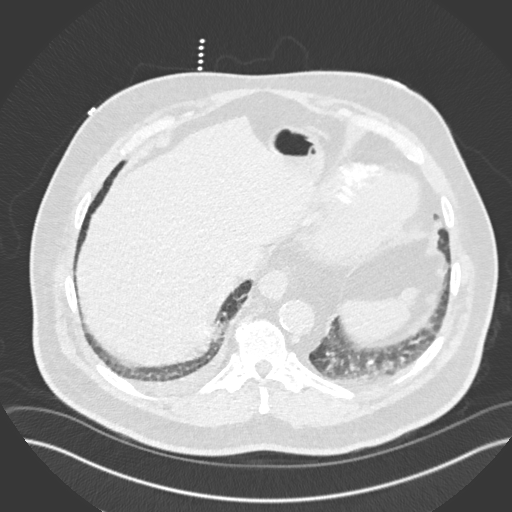
[im 59/263  soft-tissue]
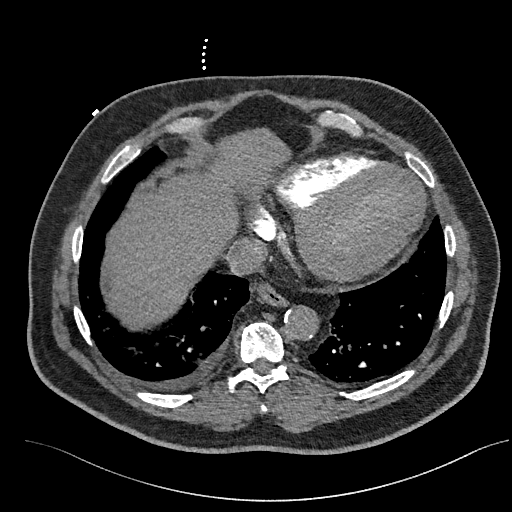
[im 73/263  lung]
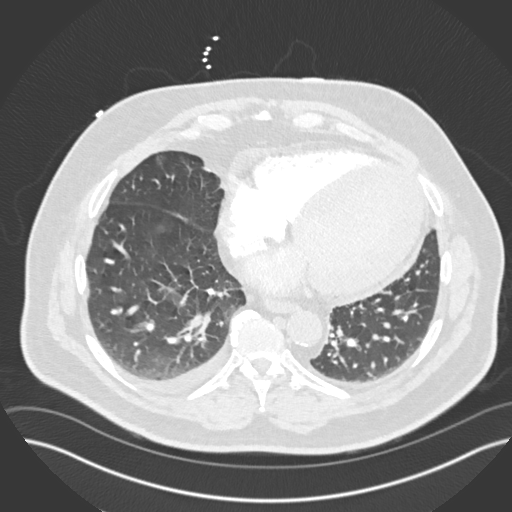
[im 88/263  soft-tissue]
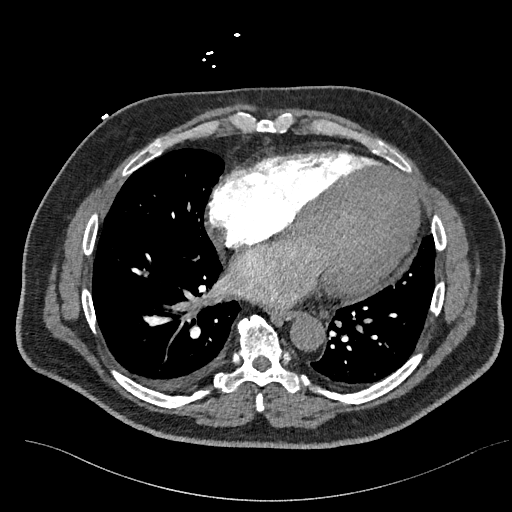
[im 102/263  lung]
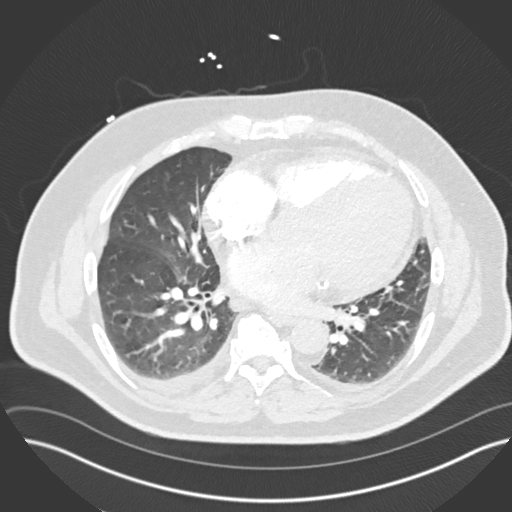
[im 117/263  soft-tissue]
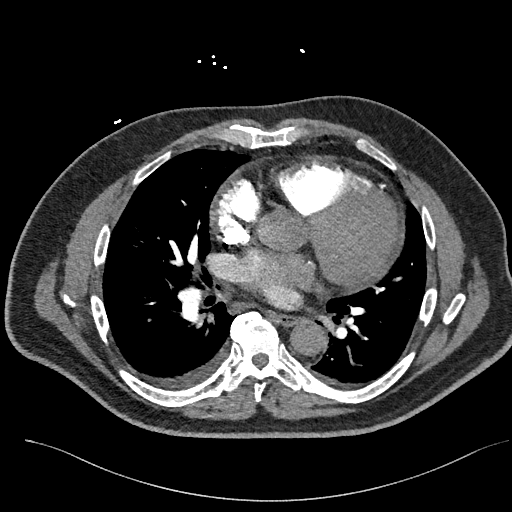
[im 146/263  lung]
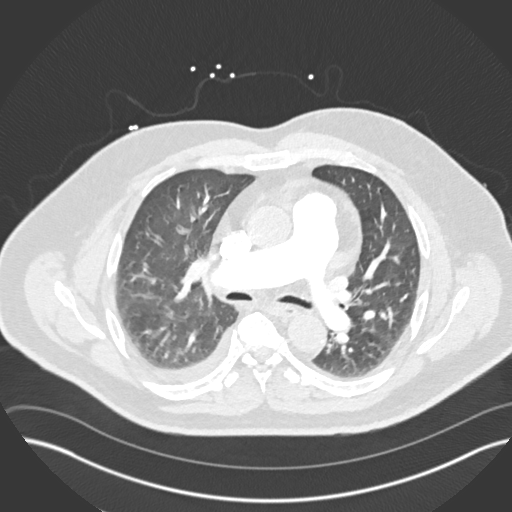
[im 161/263  soft-tissue]
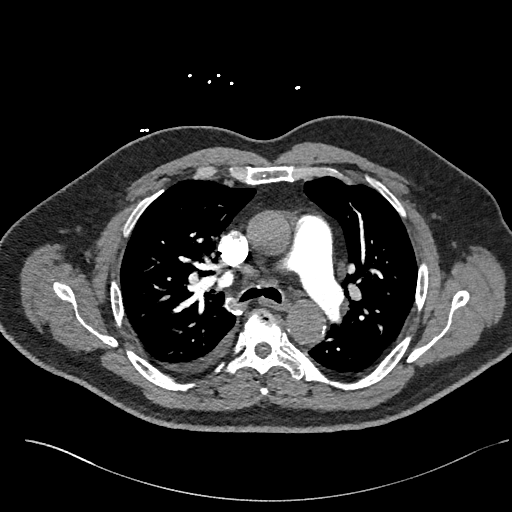
[im 175/263  lung]
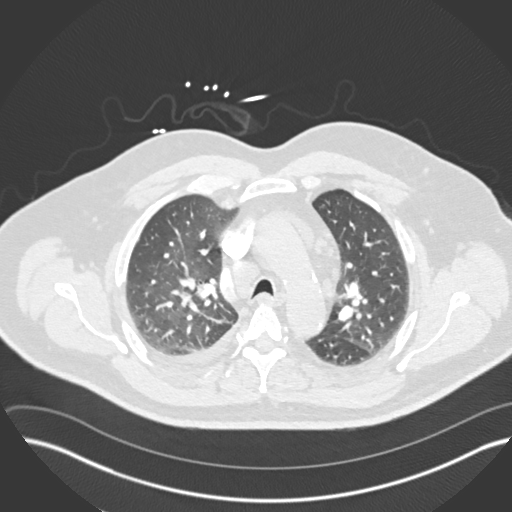
[im 190/263  soft-tissue]
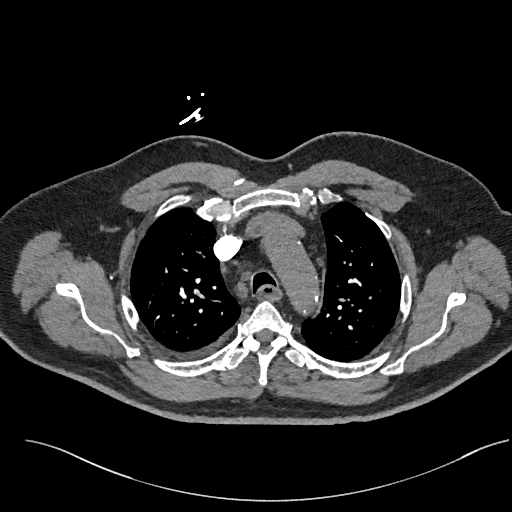
[im 204/263  lung]
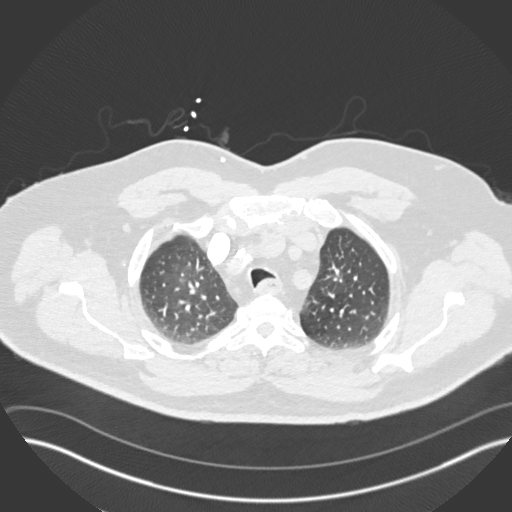
[im 219/263  soft-tissue]
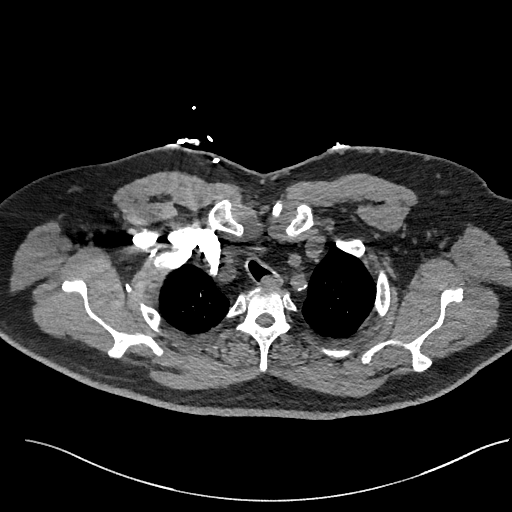
[im 233/263  lung]
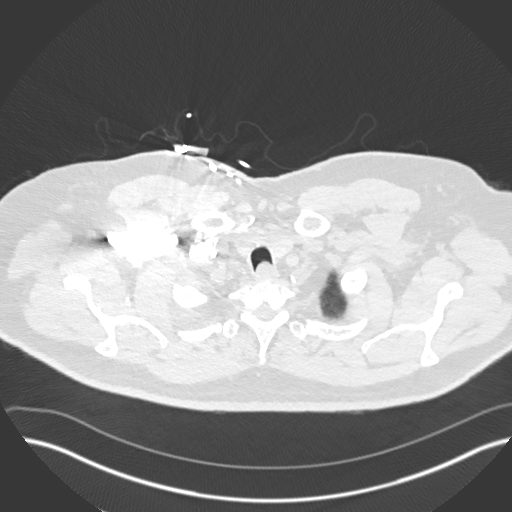
[im 248/263  soft-tissue]
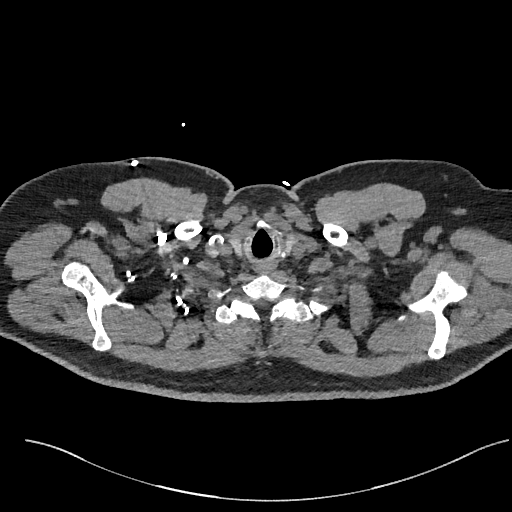

[Series 7: coronal mpr · coronal · 0.59mm/px · 2 of 92 slices shown]
[im 31/92  soft-tissue]
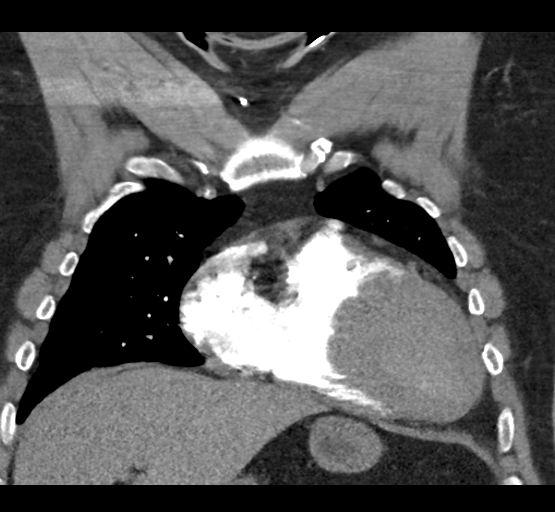
[im 61/92  soft-tissue]
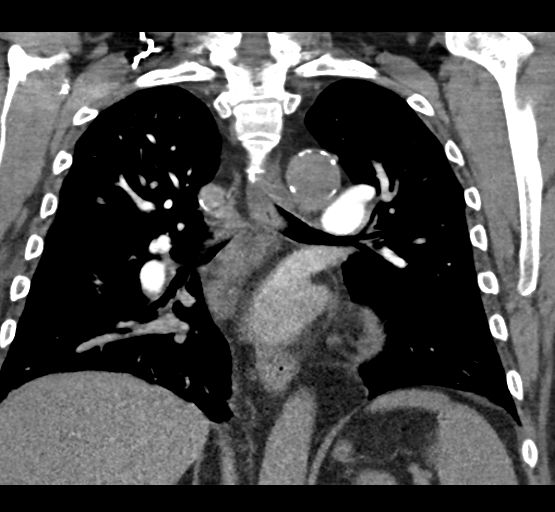

[18 of 46 positions shown; findings below may reference images not displayed]

FINDINGS: Small bilateral stress set there is a small right pleural effusion
identified. Mild dependent changes and atelectasis identified within
the lower lobes.

Heart size is mildly enlarged. At right peritracheal lymph node
measures 1.9 cm, image 32/series 4. There is a AP window lymph node
which measures stress set there is a lymph node adjacent to the main
pulmonary artery which measures 1.1 cm, image 32/series 4. Sub-
carinal lymph node measures 1.2 cm, image 51/series 4.

The main pulmonary artery appears normal. No saddle embolus
identified. There is no abnormal filling defects within the main
pulmonary artery or its branches to suggest an acute pulmonary
embolus.

No enlarged axillary or supraclavicular lymph nodes. Limited imaging
through the upper abdomen demonstrates no acute findings. Review of
the visualized osseous structures is significant for mild multilevel
spondylosis. No aggressive lytic or sclerotic bone lesions
identified.

Review of the MIP images confirms the above findings.
IMPRESSION: 1. No evidence for acute pulmonary embolus

2. Small right pleural effusion and bibasilar atelectasis.

3. Borderline enlarged hilar and mediastinal lymph nodes. This is a
nonspecific finding. Differential considerations include reactive
adenopathy, metastatic disease, granulomatous inflammation or
infection or lymphoma. Consider followup imaging in 3 months to
confirm stability.

## 2014-05-31 ENCOUNTER — Other Ambulatory Visit: Payer: Self-pay | Admitting: Internal Medicine

## 2014-06-05 ENCOUNTER — Ambulatory Visit: Payer: Self-pay | Admitting: *Deleted

## 2014-06-05 ENCOUNTER — Telehealth: Payer: Self-pay | Admitting: Internal Medicine

## 2014-06-05 ENCOUNTER — Telehealth: Payer: Self-pay | Admitting: Emergency Medicine

## 2014-06-05 VITALS — BP 180/109 | HR 105 | Temp 97.7°F | Resp 16

## 2014-06-05 MED ORDER — ALPRAZOLAM 0.5 MG PO TABS: 0.5000 mg | ORAL_TABLET | Freq: Every day | ORAL | Status: DC | PRN

## 2014-06-05 MED ORDER — CARVEDILOL 6.25 MG PO TABS: 6.2500 mg | ORAL_TABLET | Freq: Two times a day (BID) | ORAL | Status: DC

## 2014-06-05 MED ORDER — GLIPIZIDE 5 MG PO TABS: 5.0000 mg | ORAL_TABLET | Freq: Two times a day (BID) | ORAL | Status: DC

## 2014-06-05 MED ORDER — METFORMIN HCL 500 MG PO TABS: 500.0000 mg | ORAL_TABLET | Freq: Two times a day (BID) | ORAL | Status: DC

## 2014-06-05 MED ORDER — AMLODIPINE BESYLATE 10 MG PO TABS
10.0000 mg | ORAL_TABLET | Freq: Every day | ORAL | Status: DC
Start: 2014-06-05 — End: 2014-07-03

## 2014-06-05 MED ORDER — CLONIDINE HCL 0.1 MG PO TABS
0.2000 mg | ORAL_TABLET | Freq: Once | ORAL | Status: AC
  Administered 2014-06-05: 0.2 mg via ORAL

## 2014-06-05 MED ORDER — FUROSEMIDE 40 MG PO TABS
40.0000 mg | ORAL_TABLET | Freq: Every day | ORAL | Status: DC
Start: 2014-06-05 — End: 2014-07-03

## 2014-06-05 NOTE — Telephone Encounter (Signed)
Pt needs refill for Furosemide...please call patient in regards to getting this medication refilled...patient has not been seen by a provider at this clinic since October...Marland Kitchen..Marland Kitchen

## 2014-06-05 NOTE — Patient Instructions (Signed)

## 2014-06-05 NOTE — Progress Notes (Unsigned)
Patient presents to walk in clinic with c/o headache d/t blood pressure. Patient denies any blurred vision and chest pain. Patient BP 180/109. Dr. Orpah CobbAdvani made aware and 0.2 mg Clonidine given per protocol.  Patient has not been seen since 07/2013. Patient has no showed 2 appointments and canceled 2 appointments. Patient refused to have blood work done. Patient requesting refills on Lasix, Coreg, Metformin, and Xanax. Consulted with Dr. Orpah CobbAdvani. Verbal order received to give a one time refill for patient. Informed patient about plan of care. Patient also given an appointment with Dr. Orpah CobbAdvani on 07/03/2014. Patient states his insurance will be in effect by then. Annamaria Hellingose,Asiah Befort Renee, RN

## 2014-07-03 ENCOUNTER — Encounter: Payer: Self-pay | Admitting: Internal Medicine

## 2014-07-03 ENCOUNTER — Ambulatory Visit: Payer: 59 | Attending: Internal Medicine | Admitting: Internal Medicine

## 2014-07-03 VITALS — BP 143/92 | HR 102 | Temp 99.0°F | Resp 17 | Wt 251.4 lb

## 2014-07-03 DIAGNOSIS — E119 Type 2 diabetes mellitus without complications: Secondary | ICD-10-CM | POA: Diagnosis not present

## 2014-07-03 DIAGNOSIS — I509 Heart failure, unspecified: Secondary | ICD-10-CM | POA: Insufficient documentation

## 2014-07-03 DIAGNOSIS — I10 Essential (primary) hypertension: Secondary | ICD-10-CM | POA: Diagnosis not present

## 2014-07-03 DIAGNOSIS — E139 Other specified diabetes mellitus without complications: Secondary | ICD-10-CM

## 2014-07-03 DIAGNOSIS — Z8679 Personal history of other diseases of the circulatory system: Secondary | ICD-10-CM

## 2014-07-03 DIAGNOSIS — E089 Diabetes mellitus due to underlying condition without complications: Secondary | ICD-10-CM

## 2014-07-03 LAB — GLUCOSE, POCT (MANUAL RESULT ENTRY): POC GLUCOSE: 123 mg/dL — AB (ref 70–99)

## 2014-07-03 LAB — POCT GLYCOSYLATED HEMOGLOBIN (HGB A1C): HEMOGLOBIN A1C: 7.4

## 2014-07-03 MED ORDER — METFORMIN HCL 500 MG PO TABS: 500.0000 mg | ORAL_TABLET | Freq: Two times a day (BID) | ORAL | Status: DC

## 2014-07-03 MED ORDER — AMLODIPINE BESYLATE 10 MG PO TABS: 10.0000 mg | ORAL_TABLET | Freq: Every day | ORAL | Status: DC

## 2014-07-03 MED ORDER — FUROSEMIDE 40 MG PO TABS: 40.0000 mg | ORAL_TABLET | Freq: Every day | ORAL | Status: DC

## 2014-07-03 MED ORDER — CARVEDILOL 6.25 MG PO TABS: 6.2500 mg | ORAL_TABLET | Freq: Two times a day (BID) | ORAL | Status: DC

## 2014-07-03 MED ORDER — GLIPIZIDE 5 MG PO TABS
5.0000 mg | ORAL_TABLET | Freq: Two times a day (BID) | ORAL | Status: DC
Start: 2014-07-03 — End: 2014-09-04

## 2014-07-03 MED ORDER — LISINOPRIL 5 MG PO TABS
5.0000 mg | ORAL_TABLET | Freq: Two times a day (BID) | ORAL | Status: DC
Start: 2014-07-03 — End: 2015-08-22

## 2014-07-03 NOTE — Progress Notes (Signed)
Patient here for follow up on his DM Patient has declined flu vaccine

## 2014-07-03 NOTE — Patient Instructions (Signed)
DASH Eating Plan DASH stands for "Dietary Approaches to Stop Hypertension." The DASH eating plan is a healthy eating plan that has been shown to reduce high blood pressure (hypertension). Additional health benefits may include reducing the risk of type 2 diabetes mellitus, heart disease, and stroke. The DASH eating plan may also help with weight loss. WHAT DO I NEED TO KNOW ABOUT THE DASH EATING PLAN? For the DASH eating plan, you will follow these general guidelines:  Choose foods with a percent daily value for sodium of less than 5% (as listed on the food label).  Use salt-free seasonings or herbs instead of table salt or sea salt.  Check with your health care provider or pharmacist before using salt substitutes.  Eat lower-sodium products, often labeled as "lower sodium" or "no salt added."  Eat fresh foods.  Eat more vegetables, fruits, and low-fat dairy products.  Choose whole grains. Look for the word "whole" as the first word in the ingredient list.  Choose fish and skinless chicken or turkey more often than red meat. Limit fish, poultry, and meat to 6 oz (170 g) each day.  Limit sweets, desserts, sugars, and sugary drinks.  Choose heart-healthy fats.  Limit cheese to 1 oz (28 g) per day.  Eat more home-cooked food and less restaurant, buffet, and fast food.  Limit fried foods.  Cook foods using methods other than frying.  Limit canned vegetables. If you do use them, rinse them well to decrease the sodium.  When eating at a restaurant, ask that your food be prepared with less salt, or no salt if possible. WHAT FOODS CAN I EAT? Seek help from a dietitian for individual calorie needs. Grains Whole grain or whole wheat bread. Brown rice. Whole grain or whole wheat pasta. Quinoa, bulgur, and whole grain cereals. Low-sodium cereals. Corn or whole wheat flour tortillas. Whole grain cornbread. Whole grain crackers. Low-sodium crackers. Vegetables Fresh or frozen vegetables  (raw, steamed, roasted, or grilled). Low-sodium or reduced-sodium tomato and vegetable juices. Low-sodium or reduced-sodium tomato sauce and paste. Low-sodium or reduced-sodium canned vegetables.  Fruits All fresh, canned (in natural juice), or frozen fruits. Meat and Other Protein Products Ground beef (85% or leaner), grass-fed beef, or beef trimmed of fat. Skinless chicken or turkey. Ground chicken or turkey. Pork trimmed of fat. All fish and seafood. Eggs. Dried beans, peas, or lentils. Unsalted nuts and seeds. Unsalted canned beans. Dairy Low-fat dairy products, such as skim or 1% milk, 2% or reduced-fat cheeses, low-fat ricotta or cottage cheese, or plain low-fat yogurt. Low-sodium or reduced-sodium cheeses. Fats and Oils Tub margarines without trans fats. Light or reduced-fat mayonnaise and salad dressings (reduced sodium). Avocado. Safflower, olive, or canola oils. Natural peanut or almond butter. Other Unsalted popcorn and pretzels. The items listed above may not be a complete list of recommended foods or beverages. Contact your dietitian for more options. WHAT FOODS ARE NOT RECOMMENDED? Grains White bread. White pasta. White rice. Refined cornbread. Bagels and croissants. Crackers that contain trans fat. Vegetables Creamed or fried vegetables. Vegetables in a cheese sauce. Regular canned vegetables. Regular canned tomato sauce and paste. Regular tomato and vegetable juices. Fruits Dried fruits. Canned fruit in light or heavy syrup. Fruit juice. Meat and Other Protein Products Fatty cuts of meat. Ribs, chicken wings, bacon, sausage, bologna, salami, chitterlings, fatback, hot dogs, bratwurst, and packaged luncheon meats. Salted nuts and seeds. Canned beans with salt. Dairy Whole or 2% milk, cream, half-and-half, and cream cheese. Whole-fat or sweetened yogurt. Full-fat   cheeses or blue cheese. Nondairy creamers and whipped toppings. Processed cheese, cheese spreads, or cheese  curds. Condiments Onion and garlic salt, seasoned salt, table salt, and sea salt. Canned and packaged gravies. Worcestershire sauce. Tartar sauce. Barbecue sauce. Teriyaki sauce. Soy sauce, including reduced sodium. Steak sauce. Fish sauce. Oyster sauce. Cocktail sauce. Horseradish. Ketchup and mustard. Meat flavorings and tenderizers. Bouillon cubes. Hot sauce. Tabasco sauce. Marinades. Taco seasonings. Relishes. Fats and Oils Butter, stick margarine, lard, shortening, ghee, and bacon fat. Coconut, palm kernel, or palm oils. Regular salad dressings. Other Pickles and olives. Salted popcorn and pretzels. The items listed above may not be a complete list of foods and beverages to avoid. Contact your dietitian for more information. WHERE CAN I FIND MORE INFORMATION? National Heart, Lung, and Blood Institute: www.nhlbi.nih.gov/health/health-topics/topics/dash/ Document Released: 10/07/2011 Document Revised: 03/04/2014 Document Reviewed: 08/22/2013 ExitCare Patient Information 2015 [employer]. This information is not intended to replace advice given to you by your health care provider. Make sure you discuss any questions you have with your health care provider. Diabetes Mellitus and Food It is important for you to manage your blood sugar (glucose) level. Your blood glucose level can be greatly affected by what you eat. Eating healthier foods in the appropriate amounts throughout the day at about the same time each day will help you control your blood glucose level. It can also help slow or prevent worsening of your diabetes mellitus. Healthy eating may even help you improve the level of your blood pressure and reach or maintain a healthy weight.  HOW CAN FOOD AFFECT ME? Carbohydrates Carbohydrates affect your blood glucose level more than any other type of food. Your dietitian will help you determine how many carbohydrates to eat at each meal and teach you how to count carbohydrates. Counting  carbohydrates is important to keep your blood glucose at a healthy level, especially if you are using insulin or taking certain medicines for diabetes mellitus. Alcohol Alcohol can cause sudden decreases in blood glucose (hypoglycemia), especially if you use insulin or take certain medicines for diabetes mellitus. Hypoglycemia can be a life-threatening condition. Symptoms of hypoglycemia (sleepiness, dizziness, and disorientation) are similar to symptoms of having too much alcohol.  If your health care provider has given you approval to drink alcohol, do so in moderation and use the following guidelines:  Women should not have more than one drink per day, and men should not have more than two drinks per day. One drink is equal to:  12 oz of beer.  5 oz of wine.  1 oz of hard liquor.  Do not drink on an empty stomach.  Keep yourself hydrated. Have water, diet soda, or unsweetened iced tea.  Regular soda, juice, and other mixers might contain a lot of carbohydrates and should be counted. WHAT FOODS ARE NOT RECOMMENDED? As you make food choices, it is important to remember that all foods are not the same. Some foods have fewer nutrients per serving than other foods, even though they might have the same number of calories or carbohydrates. It is difficult to get your body what it needs when you eat foods with fewer nutrients. Examples of foods that you should avoid that are high in calories and carbohydrates but low in nutrients include:  Trans fats (most processed foods list trans fats on the Nutrition Facts label).  Regular soda.  Juice.  Candy.  Sweets, such as cake, pie, doughnuts, and cookies.  Fried foods. WHAT FOODS CAN I EAT? Have nutrient-rich foods,   which will nourish your body and keep you healthy. The food you should eat also will depend on several factors, including:  The calories you need.  The medicines you take.  Your weight.  Your blood glucose level.  Your  blood pressure level.  Your cholesterol level. You also should eat a variety of foods, including:  Protein, such as meat, poultry, fish, tofu, nuts, and seeds (lean animal proteins are best).  Fruits.  Vegetables.  Dairy products, such as milk, cheese, and yogurt (low fat is best).  Breads, grains, pasta, cereal, rice, and beans.  Fats such as olive oil, trans fat-free margarine, canola oil, avocado, and olives. DOES EVERYONE WITH DIABETES MELLITUS HAVE THE SAME MEAL PLAN? Because every person with diabetes mellitus is different, there is not one meal plan that works for everyone. It is very important that you meet with a dietitian who will help you create a meal plan that is just right for you. Document Released: 07/15/2005 Document Revised: 10/23/2013 Document Reviewed: 09/14/2013 ExitCare Patient Information 2015 [employer]. This information is not intended to replace advice given to you by your health care provider. Make sure you discuss any questions you have with your health care provider.  

## 2014-07-03 NOTE — Progress Notes (Signed)
MRN: [id number] Name: Louis Trevino  Sex: male Age: 59 y.o. DOB: 04-Jan-1956  Allergies: Review of patient's allergies indicates no known allergies.  Chief Complaint  Patient presents with  . Follow-up    HPI: Patient is 58 y.o. male who has history of diabetes hypertension CHF comes today for followup, denies any chest pain but does complain of occasional exertional shortness of breath denies any headache dizziness, patient has been taking Glucotrol and metformin but as per patient he only takes metformin once a day, his A1c is due improved but is still 7.4%, he denies any hypoglycemic symptoms also taking lisinopril Lasix Coreg amlodipine.  Past Medical History  Diagnosis Date  . Diabetes mellitus   . Hypertension   . CHF (congestive heart failure)     History reviewed. No pertinent past surgical history.    Medication List       This list is accurate as of: 07/03/14  5:16 PM.  Always use your most recent med list.               ALPRAZolam 0.5 MG tablet  Commonly known as:  XANAX  Take 1 tablet (0.5 mg total) by mouth daily as needed for anxiety.     amLODipine 10 MG tablet  Commonly known as:  NORVASC  Take 1 tablet (10 mg total) by mouth daily.     aspirin 81 MG EC tablet  Take 1 tablet (81 mg total) by mouth daily.     carvedilol 6.25 MG tablet  Commonly known as:  COREG  Take 1 tablet (6.25 mg total) by mouth 2 (two) times daily with a meal.     Dapagliflozin Propanediol 5 MG Tabs  Commonly known as:  FARXIGA  Take 5 mg by mouth every morning.     furosemide 40 MG tablet  Commonly known as:  LASIX  Take 1 tablet (40 mg total) by mouth daily.     glipiZIDE 5 MG tablet  Commonly known as:  GLUCOTROL  Take 1 tablet (5 mg total) by mouth 2 (two) times daily before a meal.     lisinopril 5 MG tablet  Commonly known as:  PRINIVIL,ZESTRIL  Take 1 tablet (5 mg total) by mouth 2 (two) times daily at 10 AM and 5 PM.     metFORMIN 500 MG tablet    Commonly known as:  GLUCOPHAGE  Take 1 tablet (500 mg total) by mouth 2 (two) times daily with a meal.     naproxen sodium 220 MG tablet  Commonly known as:  ANAPROX  Take 2 tablets (440 mg total) by mouth daily as needed (for pain).        Meds ordered this encounter  Medications  . glipiZIDE (GLUCOTROL) 5 MG tablet    Sig: Take 1 tablet (5 mg total) by mouth 2 (two) times daily before a meal.    Dispense:  60 tablet    Refill:  3  . furosemide (LASIX) 40 MG tablet    Sig: Take 1 tablet (40 mg total) by mouth daily.    Dispense:  30 tablet    Refill:  3  . carvedilol (COREG) 6.25 MG tablet    Sig: Take 1 tablet (6.25 mg total) by mouth 2 (two) times daily with a meal.    Dispense:  60 tablet    Refill:  3  . amLODipine (NORVASC) 10 MG tablet    Sig: Take 1 tablet (10 mg total) by mouth daily.  Dispense:  30 tablet    Refill:  3  . lisinopril (PRINIVIL,ZESTRIL) 5 MG tablet    Sig: Take 1 tablet (5 mg total) by mouth 2 (two) times daily at 10 AM and 5 PM.    Dispense:  60 tablet    Refill:  3  . metFORMIN (GLUCOPHAGE) 500 MG tablet    Sig: Take 1 tablet (500 mg total) by mouth 2 (two) times daily with a meal.    Dispense:  60 tablet    Refill:  3     There is no immunization history on file for this patient.  Family History  Problem Relation Age of Onset  . Heart disease Mother   . Stroke Mother   . Kidney failure Maternal Grandfather   . Stroke Paternal Grandmother     History  Substance Use Topics  . Smoking status: Never Smoker   . Smokeless tobacco: Not on file  . Alcohol Use: Yes    Review of Systems   As noted in HPI  Filed Vitals:   07/03/14 1641  BP: 143/92  Pulse: 102  Temp: 99 F (37.2 C)  Resp: 17    Physical Exam  Physical Exam  Constitutional:  Obese male sitting comfortably not in acute distress   Eyes: EOM are normal. Pupils are equal, round, and reactive to light.  Cardiovascular: Normal rate and regular rhythm.    Pulmonary/Chest: Breath sounds normal. No respiratory distress. He has no wheezes. He has no rales.  Abdominal: Soft. There is no tenderness.  Musculoskeletal:  1+ pedal edema     CBC    Component Value Date/Time   WBC 7.7 11/20/2013 1556   RBC 4.76 11/20/2013 1556   HGB 14.7 11/20/2013 1556   HCT 42.5 11/20/2013 1556   PLT 209 11/20/2013 1556   MCV 89.3 11/20/2013 1556   LYMPHSABS 1.2 11/20/2013 1556   MONOABS 1.1* 11/20/2013 1556   EOSABS 0.2 11/20/2013 1556   BASOSABS 0.0 11/20/2013 1556    CMP     Component Value Date/Time   NA 138 08/09/2013 1002   K 3.9 08/09/2013 1002   CL 99 08/09/2013 1002   CO2 33* 08/09/2013 1002   GLUCOSE 146* 08/09/2013 1002   BUN 19 08/09/2013 1002   CREATININE 1.38* 08/09/2013 1002   CREATININE 1.44* 07/28/2013 0500   CALCIUM 9.6 08/09/2013 1002   GFRNONAA 53* 07/28/2013 0500   GFRAA 62* 07/28/2013 0500    No results found for this basename: chol, tri, ldl    No components found with this basename: hga1c    No results found for this basename: AST    Assessment and Plan  Diabetes mellitus due to underlying condition without complications - Plan:  Results for orders placed in visit on 07/03/14  GLUCOSE, POCT (MANUAL RESULT ENTRY)      Result Value Ref Range   POC Glucose 123 (*) 70 - 99 mg/dl  POCT GLYCOSYLATED HEMOGLOBIN (HGB A1C)      Result Value Ref Range   Hemoglobin A1C 7.4     A1c is improved, patient will continue with , glipiZIDE (GLUCOTROL) 5 MG tablet, , advise patient to take metformin 2 times a day, monitor fingerstick glucose at home, advise for diabetes declining, will check Lipid panel  Essential hypertension - Plan: furosemide (LASIX) 40 MG tablet, carvedilol (COREG) 6.25 MG tablet, amLODipine (NORVASC) 10 MG tablet, COMPLETE METABOLIC PANEL WITH GFR  Type II or unspecified type diabetes mellitus without mention of complication, not stated  as uncontrolled - Plan: glipiZIDE (GLUCOTROL) 5 MG tablet, metFORMIN (GLUCOPHAGE) 500 MG  tablet  History of CHF (congestive heart failure) - Plan: lisinopril (PRINIVIL,ZESTRIL) 5 MG tablet   Return in about 3 months (around 10/02/2014) for diabetes, hypertension.  Doris Cheadle, MD

## 2014-07-04 ENCOUNTER — Telehealth: Payer: Self-pay | Admitting: Emergency Medicine

## 2014-07-04 DIAGNOSIS — R7989 Other specified abnormal findings of blood chemistry: Secondary | ICD-10-CM

## 2014-07-04 LAB — LIPID PANEL
CHOLESTEROL: 203 mg/dL — AB (ref 0–200)
HDL: 32 mg/dL — AB (ref >39)
LDL CALC: 109 mg/dL — AB (ref 0–99)
Total CHOL/HDL Ratio: 6.3 Ratio
Triglycerides: 312 mg/dL — ABNORMAL HIGH (ref <150)
VLDL: 62 mg/dL — ABNORMAL HIGH (ref 0–40)

## 2014-07-04 LAB — COMPLETE METABOLIC PANEL WITH GFR
ALT: 14 U/L (ref 0–53)
AST: 14 U/L (ref 0–37)
Albumin: 4.2 g/dL (ref 3.5–5.2)
Alkaline Phosphatase: 70 U/L (ref 39–117)
BUN: 33 mg/dL — ABNORMAL HIGH (ref 6–23)
CALCIUM: 9.8 mg/dL (ref 8.4–10.5)
CO2: 30 meq/L (ref 19–32)
Chloride: 101 mEq/L (ref 96–112)
Creat: 2.07 mg/dL — ABNORMAL HIGH (ref 0.50–1.35)
GFR, Est African American: 40 mL/min — ABNORMAL LOW
GFR, Est Non African American: 35 mL/min — ABNORMAL LOW
Glucose, Bld: 99 mg/dL (ref 70–99)
Potassium: 3.7 mEq/L (ref 3.5–5.3)
Sodium: 140 mEq/L (ref 135–145)
TOTAL PROTEIN: 7.2 g/dL (ref 6.0–8.3)
Total Bilirubin: 0.6 mg/dL (ref 0.2–1.2)

## 2014-07-04 NOTE — Telephone Encounter (Signed)
Attempted to reach pt with results. Number listed has no voicemail set up Nephrology referral placed

## 2014-07-04 NOTE — Telephone Encounter (Signed)
Message copied by Darlis Loan on Thu Jul 04, 2014 12:36 PM ------      Message from: Doris Cheadle      Created: Thu Jul 04, 2014  9:48 AM       Blood work reviewed, noticed elevated triglycerides, advise patient for low fat diet.       Also noticed worsening renal function compared to blood work done in October 2014, at this point patient needs  to see a nephrologist, put in the referral. ------

## 2014-07-26 ENCOUNTER — Telehealth: Payer: Self-pay | Admitting: Internal Medicine

## 2014-07-26 NOTE — Telephone Encounter (Signed)
Louis Trevino form [facility] called stating that pt. Refused to set up an appt with them because pt. Did not know why he was being referred there. Please f/u Louis Trevino at [facility].

## 2014-07-26 NOTE — Telephone Encounter (Signed)
Pt. Called to request some more information on why he was referred to a kidney specialist by his PCP. Please f/u with pt.

## 2014-08-12 ENCOUNTER — Other Ambulatory Visit: Payer: Self-pay | Admitting: Internal Medicine

## 2014-09-04 ENCOUNTER — Other Ambulatory Visit: Payer: Self-pay | Admitting: Emergency Medicine

## 2014-09-04 DIAGNOSIS — E089 Diabetes mellitus due to underlying condition without complications: Secondary | ICD-10-CM

## 2014-09-04 DIAGNOSIS — E119 Type 2 diabetes mellitus without complications: Secondary | ICD-10-CM

## 2014-09-04 MED ORDER — GLIPIZIDE 5 MG PO TABS: 5.0000 mg | ORAL_TABLET | Freq: Two times a day (BID) | ORAL | Status: DC

## 2014-09-10 ENCOUNTER — Other Ambulatory Visit: Payer: Self-pay | Admitting: Internal Medicine

## 2014-09-17 ENCOUNTER — Other Ambulatory Visit: Payer: Self-pay | Admitting: Internal Medicine

## 2014-10-07 ENCOUNTER — Telehealth: Payer: Self-pay | Admitting: Internal Medicine

## 2014-10-07 ENCOUNTER — Other Ambulatory Visit: Payer: Self-pay | Admitting: Internal Medicine

## 2014-10-07 NOTE — Telephone Encounter (Signed)
Patient presents to clinic to request medication refill for the following: Prilosam. Patient uses [facility] on [address] in [place]. Please contact patient.

## 2014-10-09 ENCOUNTER — Telehealth: Payer: Self-pay | Admitting: Emergency Medicine

## 2014-10-09 NOTE — Telephone Encounter (Signed)
Patient presents to clinic to request medication refill for the following: Prilosam. Patient uses [facility] on [address] in [place]. Please contact patient.              Generic name for Xanax

## 2014-10-09 NOTE — Telephone Encounter (Signed)
Patient was seen 3 months ago needs to make a follow up appointment.

## 2014-10-23 ENCOUNTER — Telehealth: Payer: Self-pay

## 2014-10-23 ENCOUNTER — Telehealth: Payer: Self-pay | Admitting: Internal Medicine

## 2014-10-23 DIAGNOSIS — I10 Essential (primary) hypertension: Secondary | ICD-10-CM

## 2014-10-23 MED ORDER — ALPRAZOLAM 0.5 MG PO TABS: 0.5000 mg | ORAL_TABLET | Freq: Every day | ORAL | Status: DC | PRN

## 2014-10-23 NOTE — Telephone Encounter (Signed)
Patient called requesting a refill on his xanax Patient states he only uses it PRN Per Dr Orpah CobbAdvani we will refill the priscription #30 with no refills

## 2014-10-23 NOTE — Telephone Encounter (Signed)
Patient is calling to request a medication refill for ALPRAZolam (XANAX) 0.5 MG tablet, patient states that he would like to speak to nurse. Please f/u with pt.

## 2014-11-29 ENCOUNTER — Telehealth: Payer: Self-pay

## 2014-11-29 ENCOUNTER — Other Ambulatory Visit: Payer: Self-pay | Admitting: Internal Medicine

## 2014-11-29 DIAGNOSIS — I10 Essential (primary) hypertension: Secondary | ICD-10-CM

## 2014-11-29 MED ORDER — FUROSEMIDE 40 MG PO TABS
40.0000 mg | ORAL_TABLET | Freq: Every day | ORAL | Status: DC
Start: 2014-11-29 — End: 2015-10-29

## 2014-11-29 NOTE — Telephone Encounter (Signed)
Patient called requesting a refill on his lasix for his CHF Prescription sent to pharmacy on file

## 2014-12-04 ENCOUNTER — Other Ambulatory Visit: Payer: Self-pay | Admitting: Internal Medicine

## 2014-12-12 ENCOUNTER — Ambulatory Visit: Payer: 59 | Admitting: Internal Medicine

## 2015-08-19 ENCOUNTER — Other Ambulatory Visit: Payer: Self-pay | Admitting: Internal Medicine

## 2015-08-19 NOTE — Telephone Encounter (Signed)
Pt. Called requesting a med refill on all his current med. Pt. Does not remember the name of his medications. Please f/u with pt.

## 2015-08-20 ENCOUNTER — Telehealth: Payer: Self-pay

## 2015-08-20 NOTE — Telephone Encounter (Signed)
Attempted to return call to patient to find out which Medications he needs refilled Patient not available Unable to leave message Voice mail not set up

## 2015-08-22 ENCOUNTER — Encounter: Payer: Self-pay | Admitting: Family Medicine

## 2015-08-22 ENCOUNTER — Ambulatory Visit: Payer: 59 | Attending: Family Medicine | Admitting: Family Medicine

## 2015-08-22 VITALS — BP 182/106 | HR 118 | Temp 97.8°F | Resp 18 | Ht 67.0 in | Wt 247.0 lb

## 2015-08-22 DIAGNOSIS — E119 Type 2 diabetes mellitus without complications: Secondary | ICD-10-CM | POA: Diagnosis not present

## 2015-08-22 DIAGNOSIS — E089 Diabetes mellitus due to underlying condition without complications: Secondary | ICD-10-CM

## 2015-08-22 DIAGNOSIS — R000 Tachycardia, unspecified: Secondary | ICD-10-CM | POA: Insufficient documentation

## 2015-08-22 DIAGNOSIS — Z8249 Family history of ischemic heart disease and other diseases of the circulatory system: Secondary | ICD-10-CM | POA: Insufficient documentation

## 2015-08-22 DIAGNOSIS — Z9111 Patient's noncompliance with dietary regimen: Secondary | ICD-10-CM | POA: Insufficient documentation

## 2015-08-22 DIAGNOSIS — Z7982 Long term (current) use of aspirin: Secondary | ICD-10-CM | POA: Insufficient documentation

## 2015-08-22 DIAGNOSIS — E1165 Type 2 diabetes mellitus with hyperglycemia: Secondary | ICD-10-CM | POA: Insufficient documentation

## 2015-08-22 DIAGNOSIS — I10 Essential (primary) hypertension: Secondary | ICD-10-CM

## 2015-08-22 DIAGNOSIS — Z8679 Personal history of other diseases of the circulatory system: Secondary | ICD-10-CM

## 2015-08-22 DIAGNOSIS — Z823 Family history of stroke: Secondary | ICD-10-CM | POA: Insufficient documentation

## 2015-08-22 DIAGNOSIS — Z79899 Other long term (current) drug therapy: Secondary | ICD-10-CM | POA: Insufficient documentation

## 2015-08-22 DIAGNOSIS — I5022 Chronic systolic (congestive) heart failure: Secondary | ICD-10-CM | POA: Diagnosis not present

## 2015-08-22 DIAGNOSIS — I110 Hypertensive heart disease with heart failure: Secondary | ICD-10-CM | POA: Insufficient documentation

## 2015-08-22 DIAGNOSIS — Z7984 Long term (current) use of oral hypoglycemic drugs: Secondary | ICD-10-CM | POA: Insufficient documentation

## 2015-08-22 LAB — POCT GLYCOSYLATED HEMOGLOBIN (HGB A1C): Hemoglobin A1C: 12.3

## 2015-08-22 LAB — GLUCOSE, POCT (MANUAL RESULT ENTRY)
POC Glucose: 468 mg/dl — AB (ref 70–99)
POC Glucose: 485 mg/dl — AB (ref 70–99)

## 2015-08-22 MED ORDER — ASPIRIN 81 MG PO TBEC: 81.0000 mg | DELAYED_RELEASE_TABLET | Freq: Every day | ORAL | Status: DC

## 2015-08-22 MED ORDER — GLIPIZIDE 10 MG PO TABS: 10.0000 mg | ORAL_TABLET | Freq: Two times a day (BID) | ORAL | Status: DC

## 2015-08-22 MED ORDER — AMLODIPINE BESYLATE 10 MG PO TABS: 10.0000 mg | ORAL_TABLET | Freq: Every day | ORAL | Status: DC

## 2015-08-22 MED ORDER — LISINOPRIL 5 MG PO TABS: 5.0000 mg | ORAL_TABLET | Freq: Two times a day (BID) | ORAL | Status: DC

## 2015-08-22 MED ORDER — INSULIN ASPART 100 UNIT/ML ~~LOC~~ SOLN: 10.0000 [IU] | Freq: Once | SUBCUTANEOUS | Status: AC

## 2015-08-22 MED ORDER — CARVEDILOL 6.25 MG PO TABS: 6.2500 mg | ORAL_TABLET | Freq: Two times a day (BID) | ORAL | Status: DC

## 2015-08-22 NOTE — Patient Instructions (Signed)
Diabetes Mellitus and Food It is important for you to manage your blood sugar (glucose) level. Your blood glucose level can be greatly affected by what you eat. Eating healthier foods in the appropriate amounts throughout the day at about the same time each day will help you control your blood glucose level. It can also help slow or prevent worsening of your diabetes mellitus. Healthy eating may even help you improve the level of your blood pressure and reach or maintain a healthy weight.  General recommendations for healthful eating and cooking habits include:  Eating meals and snacks regularly. Avoid going long periods of time without eating to lose weight.  Eating a diet that consists mainly of plant-based foods, such as fruits, vegetables, nuts, legumes, and whole grains.  Using low-heat cooking methods, such as baking, instead of high-heat cooking methods, such as deep frying. Work with your dietitian to make sure you understand how to use the Nutrition Facts information on food labels. HOW CAN FOOD AFFECT ME? Carbohydrates Carbohydrates affect your blood glucose level more than any other type of food. Your dietitian will help you determine how many carbohydrates to eat at each meal and teach you how to count carbohydrates. Counting carbohydrates is important to keep your blood glucose at a healthy level, especially if you are using insulin or taking certain medicines for diabetes mellitus. Alcohol Alcohol can cause sudden decreases in blood glucose (hypoglycemia), especially if you use insulin or take certain medicines for diabetes mellitus. Hypoglycemia can be a life-threatening condition. Symptoms of hypoglycemia (sleepiness, dizziness, and disorientation) are similar to symptoms of having too much alcohol.  If your health care provider has given you approval to drink alcohol, do so in moderation and use the following guidelines:  Women should not have more than one drink per day, and men  should not have more than two drinks per day. One drink is equal to:  12 oz of beer.  5 oz of wine.  1 oz of hard liquor.  Do not drink on an empty stomach.  Keep yourself hydrated. Have water, diet soda, or unsweetened iced tea.  Regular soda, juice, and other mixers might contain a lot of carbohydrates and should be counted. WHAT FOODS ARE NOT RECOMMENDED? As you make food choices, it is important to remember that all foods are not the same. Some foods have fewer nutrients per serving than other foods, even though they might have the same number of calories or carbohydrates. It is difficult to get your body what it needs when you eat foods with fewer nutrients. Examples of foods that you should avoid that are high in calories and carbohydrates but low in nutrients include:  Trans fats (most processed foods list trans fats on the Nutrition Facts label).  Regular soda.  Juice.  Candy.  Sweets, such as cake, pie, doughnuts, and cookies.  Fried foods. WHAT FOODS CAN I EAT? Eat nutrient-rich foods, which will nourish your body and keep you healthy. The food you should eat also will depend on several factors, including:  The calories you need.  The medicines you take.  Your weight.  Your blood glucose level.  Your blood pressure level.  Your cholesterol level. You should eat a variety of foods, including:  Protein.  Lean cuts of meat.  Proteins low in saturated fats, such as fish, egg whites, and beans. Avoid processed meats.  Fruits and vegetables.  Fruits and vegetables that may help control blood glucose levels, such as apples, mangoes, and   yams.  Dairy products.  Choose fat-free or low-fat dairy products, such as milk, yogurt, and cheese.  Grains, bread, pasta, and rice.  Choose whole grain products, such as multigrain bread, whole oats, and brown rice. These foods may help control blood pressure.  Fats.  Foods containing healthful fats, such as nuts,  avocado, olive oil, canola oil, and fish. DOES EVERYONE WITH DIABETES MELLITUS HAVE THE SAME MEAL PLAN? Because every person with diabetes mellitus is different, there is not one meal plan that works for everyone. It is very important that you meet with a dietitian who will help you create a meal plan that is just right for you.   This information is not intended to replace advice given to you by your health care provider. Make sure you discuss any questions you have with your health care provider.   Document Released: 07/15/2005 Document Revised: 11/08/2014 Document Reviewed: 09/14/2013 Elsevier Interactive Patient Education 2016 Elsevier Inc.  

## 2015-08-22 NOTE — Progress Notes (Signed)
CC:  HPI: Louis Trevino is a 59 y.o. male who was last seen here in the clinic a year ago with a history of type 2 diabetes mellitus (A1c of 7.4 from 07/03/14), systolic CHF (EF of 20-25% from 2-D echo of 07/2013), hypertension who comes into the clinic today because he has run out of medications.  He tells me that he works out of town and this has prevented him from being compliant with his appointments. He has been stretching his metformin and glipizide so he would've run out. His medication list reveals glipizide and metformin twice daily dosing but he emphasizes that he has always taken them out once daily dosing and his A1c was good. He has not been compliant with ADA diet or exercise.  For his CHF he is maintained on Lasix which he takes and does not see a cardiologist. Does not check his weight and maintained dietary indiscretion. He is requesting a refill on xanax which she takes as needed but I have informed him I will be unable to prescribe that for him and he will have to get established with a PCP and discuss this with his new PCP. His blood pressure is severely elevated- 185/122 and blood sugar is elevated at 485. Patient has No headache, No chest pain, No abdominal pain - No Nausea, No new weakness tingling or numbness, No Cough - SOB.  No Known Allergies Past Medical History  Diagnosis Date  . Diabetes mellitus   . Hypertension   . CHF (congestive heart failure)    Current Outpatient Prescriptions on File Prior to Visit  Medication Sig Dispense Refill  . ALPRAZolam (XANAX) 0.5 MG tablet Take 1 tablet (0.5 mg total) by mouth daily as needed for anxiety. 30 tablet 0  . amLODipine (NORVASC) 10 MG tablet TAKE 1 TABLET BY MOUTH ONCE DAILY 30 tablet 3  . aspirin EC 81 MG EC tablet Take 1 tablet (81 mg total) by mouth daily. 30 tablet 0  . carvedilol (COREG) 6.25 MG tablet TAKE 1 TABLET BY MOUTH TWICE DAILY WITH A MEAL 60 tablet 11  . Dapagliflozin Propanediol (FARXIGA) 5 MG TABS  Take 5 mg by mouth every morning. 30 tablet 7  . furosemide (LASIX) 40 MG tablet Take 1 tablet (40 mg total) by mouth daily. 30 tablet 3  . glipiZIDE (GLUCOTROL) 5 MG tablet Take 1 tablet (5 mg total) by mouth 2 (two) times daily before a meal. 60 tablet 3  . lisinopril (PRINIVIL,ZESTRIL) 5 MG tablet Take 1 tablet (5 mg total) by mouth 2 (two) times daily at 10 AM and 5 PM. 60 tablet 3  . metFORMIN (GLUCOPHAGE) 500 MG tablet Take 1 tablet (500 mg total) by mouth 2 (two) times daily with a meal. 60 tablet 3  . naproxen sodium (ANAPROX) 220 MG tablet Take 2 tablets (440 mg total) by mouth daily as needed (for pain). 45 tablet 3   No current facility-administered medications on file prior to visit.   Family History  Problem Relation Age of Onset  . Heart disease Mother   . Stroke Mother   . Kidney failure Maternal Grandfather   . Stroke Paternal Grandmother    Social History   Social History  . Marital Status: Widowed    Spouse Name: N/A  . Number of Children: N/A  . Years of Education: N/A   Occupational History  . Not on file.   Social History Main Topics  . Smoking status: Never Smoker   . Smokeless  tobacco: Not on file  . Alcohol Use: Yes  . Drug Use: No  . Sexual Activity: Not on file   Other Topics Concern  . Not on file   Social History Narrative  . No narrative on file    Review of Systems: Constitutional: Negative for fever, chills, diaphoresis, activity change, appetite change and fatigue. HENT: Negative for ear pain, nosebleeds, congestion, facial swelling, rhinorrhea, neck pain, neck stiffness and ear discharge.  Eyes: Negative for pain, discharge, redness, itching and visual disturbance. Respiratory: Negative for cough, choking, chest tightness, shortness of breath, wheezing and stridor.  Cardiovascular: Negative for chest pain, palpitations and leg swelling. Gastrointestinal: Negative for abdominal distention. Genitourinary: Negative for dysuria, urgency,  frequency, hematuria, flank pain, decreased urine volume, difficulty urinating and dyspareunia.  Musculoskeletal: Negative for back pain, joint swelling, arthralgias and gait problem. Neurological: Negative for dizziness, tremors, seizures, syncope, facial asymmetry, speech difficulty, weakness, light-headedness, numbness and headaches.  Hematological: Negative for adenopathy. Does not bruise/bleed easily. Psychiatric/Behavioral: Negative for hallucinations, behavioral problems, confusion, dysphoric mood, decreased concentration and agitation.    Objective: Filed Vitals:   08/22/15 1613  BP: 185/122  Pulse: 118  Temp: 97.8 F (36.6 C)  TempSrc: Oral  Resp: 18  Height:  (1.702 m)  Weight: 247 lb (112.038 kg)  SpO2: 95%       Physical Exam: Constitutional: Patient appears well-developed and well-nourished. No distress. HENT: Normocephalic, atraumatic, External right and left ear normal. Oropharynx is clear and moist.  Eyes: Conjunctivae and EOM are normal. PERRLA, no scleral icterus. Neck: Normal ROM. Neck supple. No JVD. No tracheal deviation. No thyromegaly. CVS: RRR, S1/S2 +, no murmurs, no gallops, no carotid bruit.  Pulmonary: Effort and breath sounds normal, no stridor, rhonchi, wheezes, rales.  Abdominal: Soft. BS +,  no distension, tenderness, rebound or guarding.  Musculoskeletal: Normal range of motion. No edema and no tenderness.  Lymphadenopathy: No lymphadenopathy noted, cervical, inguinal or axillary Neuro: Alert. Normal reflexes, muscle tone coordination. No cranial nerve deficit. Skin: Skin is warm and dry. No rash noted. Not diaphoretic. No erythema. No pallor. Psychiatric: Normal mood and affect. Behavior, judgment, thought content normal.  Lab Results  Component Value Date   WBC 7.7 11/20/2013   HGB 14.7 11/20/2013   HCT 42.5 11/20/2013   MCV 89.3 11/20/2013   PLT 209 11/20/2013   Lab Results  Component Value Date   CREATININE 2.07* 07/03/2014    BUN 33* 07/03/2014   NA 140 07/03/2014   K 3.7 07/03/2014   CL 101 07/03/2014   CO2 30 07/03/2014    Lab Results  Component Value Date   HGBA1C 7.4 07/03/2014   Lipid Panel     Component Value Date/Time   CHOL 203* 07/03/2014 1720   TRIG 312* 07/03/2014 1720   HDL 32* 07/03/2014 1720   CHOLHDL 6.3 07/03/2014 1720   VLDL 62* 07/03/2014 1720   LDLCALC 109* 07/03/2014 1720   Lab Results  Component Value Date   HGBA1C 12.30 08/22/2015       Assessment and plan:  Type 2 diabetes mellitus: Uncontrolled with A1c of 12.3 Glipizide increased to 10 mg twice daily. Metformin discontinued due to CHF with low EF. He will need to return to get established with a PCP and to review his blood sugar logs  Accelerated hypertension: Uncontrolled Clonidine 0.2 mg given and patient observed for 40 minutes and blood pressure repeated. Refilled all his antihypertensives and he will need to return for blood pressure reassessment of  his next visit.  Chronic systolic CHF: EF 40-98%20-25% Continue Lasix and I will check a complete metabolic panel today. Advised limiting fluid intake to 2 L per day, daily weights, low sodium diet. I have explained to him that he will need a cardiologist right now he is not ready for that referral as he states he is usually out of town.  Sinus tachycardia: He has been off his beta blocker hence tachycardia on exam  I have informed him he would have to discuss refill of his xanax at his next visit with his new PCP.  This note has been created with Education officer, environmentalDragon speech recognition software and smart phrase technology. Any transcriptional errors are unintentional.          Jaclyn ShaggyEnobong, Amao, MD. [facility] [phone] 08/22/2015, 4:19 PM

## 2015-08-22 NOTE — Progress Notes (Signed)
Pt here for DM f/up. Pt denies pain. Pt needs rx refills. Pt states not taking any meds today.

## 2015-08-23 LAB — COMPREHENSIVE METABOLIC PANEL
ALK PHOS: 96 U/L (ref 40–115)
ALT: 21 U/L (ref 9–46)
AST: 15 U/L (ref 10–35)
Albumin: 3.5 g/dL — ABNORMAL LOW (ref 3.6–5.1)
BILIRUBIN TOTAL: 0.5 mg/dL (ref 0.2–1.2)
BUN: 23 mg/dL (ref 7–25)
CALCIUM: 8.8 mg/dL (ref 8.6–10.3)
CO2: 25 mmol/L (ref 20–31)
Chloride: 96 mmol/L — ABNORMAL LOW (ref 98–110)
Creat: 1.46 mg/dL — ABNORMAL HIGH (ref 0.70–1.33)
GLUCOSE: 452 mg/dL — AB (ref 65–99)
POTASSIUM: 3.6 mmol/L (ref 3.5–5.3)
Sodium: 132 mmol/L — ABNORMAL LOW (ref 135–146)
Total Protein: 6.7 g/dL (ref 6.1–8.1)

## 2015-08-23 LAB — MICROALBUMIN / CREATININE URINE RATIO
CREATININE, URINE: 75 mg/dL (ref 20–370)
MICROALB UR: 203.4 mg/dL
MICROALB/CREAT RATIO: 2712 ug/mg{creat} — AB (ref <30)

## 2015-08-26 ENCOUNTER — Telehealth: Payer: Self-pay

## 2015-08-26 NOTE — Telephone Encounter (Signed)
-----   Message from Jaclyn ShaggyEnobong Amao, MD sent at 08/25/2015  8:29 AM EDT ----- Kidney function has improved compared to last set of labs; He does have hyperglycemia and some microalbuminuria which are as a result of uncontrolled DM.

## 2015-08-26 NOTE — Telephone Encounter (Signed)
Pt called me back and was given lab results. Pt verbalized he understood his results with no further questions.

## 2015-08-26 NOTE — Telephone Encounter (Signed)
CMA called pt, pt wasn't available. Left a generic message to return my call asap.

## 2015-09-12 ENCOUNTER — Ambulatory Visit: Payer: 59 | Admitting: Family Medicine

## 2015-10-29 ENCOUNTER — Telehealth: Payer: Self-pay | Admitting: Internal Medicine

## 2015-10-29 DIAGNOSIS — I10 Essential (primary) hypertension: Secondary | ICD-10-CM

## 2015-10-29 DIAGNOSIS — Z8679 Personal history of other diseases of the circulatory system: Secondary | ICD-10-CM

## 2015-10-29 MED ORDER — CARVEDILOL 6.25 MG PO TABS: 6.2500 mg | ORAL_TABLET | Freq: Two times a day (BID) | ORAL | Status: DC

## 2015-10-29 MED ORDER — LISINOPRIL 5 MG PO TABS: 5.0000 mg | ORAL_TABLET | Freq: Two times a day (BID) | ORAL | Status: DC

## 2015-10-29 MED ORDER — AMLODIPINE BESYLATE 10 MG PO TABS: 10.0000 mg | ORAL_TABLET | Freq: Every day | ORAL | Status: DC

## 2015-10-29 MED ORDER — GLIPIZIDE 10 MG PO TABS: 10.0000 mg | ORAL_TABLET | Freq: Two times a day (BID) | ORAL | Status: DC

## 2015-10-29 MED ORDER — FUROSEMIDE 40 MG PO TABS: 40.0000 mg | ORAL_TABLET | Freq: Every day | ORAL | Status: DC

## 2015-10-29 NOTE — Telephone Encounter (Signed)
Patient called and requested a med refill for the following medications:  furosemide (LASIX) 40 MG tablet glipiZIDE (GLUCOTROL) 10 MG tablet  carvedilol (COREG) 6.25 MG tablet  lisinopril (PRINIVIL,ZESTRIL) 5 MG tablet amLODipine (NORVASC) 10 MG tablet  Patient would like enough medication to last until next appointment. Please f/u

## 2015-10-29 NOTE — Telephone Encounter (Signed)
Returned call to patient  Patient will receive a one month supply on his medications No refills until patient makes an appointment to be seen by his primary doctor Patient is aware of this

## 2015-12-03 ENCOUNTER — Other Ambulatory Visit: Payer: Self-pay | Admitting: Internal Medicine

## 2015-12-03 MED FILL — glipiZIDE 10 MG TABS: 10 | 30 days supply | Qty: 60 | Fill #1

## 2015-12-03 MED FILL — ?AMLODIPINE BESYLATE 10 MG: 10 | 30 days supply | Qty: 30 | Fill #1

## 2015-12-03 MED FILL — LISINOPRIL 5 MG TABLET: 5 | 30 days supply | Qty: 60 | Fill #1

## 2015-12-03 MED FILL — CARVEDILOL 6.25 MG TABLET: 6.25 | 30 days supply | Qty: 60 | Fill #1

## 2015-12-04 ENCOUNTER — Telehealth: Payer: Self-pay | Admitting: Internal Medicine

## 2015-12-04 ENCOUNTER — Other Ambulatory Visit: Payer: Self-pay | Admitting: Internal Medicine

## 2015-12-04 ENCOUNTER — Telehealth: Payer: Self-pay

## 2015-12-04 DIAGNOSIS — I10 Essential (primary) hypertension: Secondary | ICD-10-CM

## 2015-12-04 MED ORDER — FUROSEMIDE 40 MG PO TABS: 40.0000 mg | ORAL_TABLET | Freq: Every day | ORAL | Status: DC

## 2015-12-04 MED FILL — ?FUROSEMIDE 40 MG TABLET: 40 | 30 days supply | Qty: 30 | Fill #0

## 2015-12-04 NOTE — Telephone Encounter (Signed)
Patient relative called requesting a medication refill for furosemide. Patient relative was read nurse instructions from 12/28. Patient was instructed to make doctor appointments in order to get any other refills. \ Please follow up.

## 2015-12-04 NOTE — Telephone Encounter (Signed)
Pt. Called requesting a med refill Lasix. Pt. Stated she can not get off work to come to an appointment. Pt. Was read the instructions on the last visit note, and pt. stated he needs to speak to the nurse.

## 2015-12-04 NOTE — Telephone Encounter (Signed)
Pt. Called requesting a med refill on Lasix. Please f/u with pt.

## 2015-12-04 NOTE — Telephone Encounter (Signed)
Returned patient phone call Patient is in need of a refill on his lasix [facility]

## 2016-01-09 ENCOUNTER — Other Ambulatory Visit: Payer: Self-pay

## 2016-01-09 ENCOUNTER — Other Ambulatory Visit: Payer: Self-pay | Admitting: Family Medicine

## 2016-01-09 DIAGNOSIS — I10 Essential (primary) hypertension: Secondary | ICD-10-CM

## 2016-01-09 DIAGNOSIS — Z8679 Personal history of other diseases of the circulatory system: Secondary | ICD-10-CM

## 2016-01-09 MED ORDER — FUROSEMIDE 40 MG PO TABS: 40.0000 mg | ORAL_TABLET | Freq: Every day | ORAL | Status: DC

## 2016-01-09 MED ORDER — LISINOPRIL 5 MG PO TABS: 5.0000 mg | ORAL_TABLET | Freq: Two times a day (BID) | ORAL | Status: DC

## 2016-01-09 MED ORDER — CARVEDILOL 6.25 MG PO TABS: 6.2500 mg | ORAL_TABLET | Freq: Two times a day (BID) | ORAL | Status: DC

## 2016-01-09 MED ORDER — AMLODIPINE BESYLATE 10 MG PO TABS: 10.0000 mg | ORAL_TABLET | Freq: Every day | ORAL | Status: DC

## 2016-01-09 MED ORDER — GLIPIZIDE 10 MG PO TABS: 10.0000 mg | ORAL_TABLET | Freq: Two times a day (BID) | ORAL | Status: DC

## 2016-01-09 MED FILL — glipiZIDE 10 MG TABS: 10 | 30 days supply | Qty: 60 | Fill #1

## 2016-01-09 NOTE — Telephone Encounter (Signed)
Patient needs refills on all his medications....please follow up with patient

## 2016-01-12 MED FILL — LISINOPRIL 5 MG TABLET: 5 | 30 days supply | Qty: 60 | Fill #0

## 2016-01-12 MED FILL — FUROSEMIDE 40 MG TABLET: 40 | 30 days supply | Qty: 30 | Fill #0

## 2016-01-12 MED FILL — AMLODIPINE BESYLATE 10 MG T: 10 | 30 days supply | Qty: 30 | Fill #0

## 2016-01-12 MED FILL — CARVEDILOL 6.25 MG TABLET: 6.25 | 30 days supply | Qty: 60 | Fill #0

## 2016-01-12 MED FILL — ?GLIPIZIDE 10 MG TABLET: 10 | 28 days supply | Qty: 57 | Fill #0

## 2016-01-30 ENCOUNTER — Ambulatory Visit: Payer: 59 | Admitting: Family Medicine

## 2016-02-06 ENCOUNTER — Ambulatory Visit: Payer: 59 | Admitting: Family Medicine

## 2016-03-10 ENCOUNTER — Telehealth: Payer: Self-pay | Admitting: Family Medicine

## 2016-03-10 ENCOUNTER — Encounter: Payer: Self-pay | Admitting: Family Medicine

## 2016-03-10 ENCOUNTER — Ambulatory Visit: Payer: 59 | Attending: Family Medicine | Admitting: Family Medicine

## 2016-03-10 VITALS — BP 174/108 | HR 112 | Temp 98.3°F | Resp 18 | Ht 67.0 in

## 2016-03-10 DIAGNOSIS — E1122 Type 2 diabetes mellitus with diabetic chronic kidney disease: Secondary | ICD-10-CM

## 2016-03-10 DIAGNOSIS — N182 Chronic kidney disease, stage 2 (mild): Secondary | ICD-10-CM | POA: Diagnosis not present

## 2016-03-10 DIAGNOSIS — R000 Tachycardia, unspecified: Secondary | ICD-10-CM | POA: Diagnosis not present

## 2016-03-10 DIAGNOSIS — N189 Chronic kidney disease, unspecified: Secondary | ICD-10-CM | POA: Insufficient documentation

## 2016-03-10 DIAGNOSIS — G8929 Other chronic pain: Secondary | ICD-10-CM | POA: Insufficient documentation

## 2016-03-10 DIAGNOSIS — M25571 Pain in right ankle and joints of right foot: Secondary | ICD-10-CM

## 2016-03-10 DIAGNOSIS — Z8679 Personal history of other diseases of the circulatory system: Secondary | ICD-10-CM

## 2016-03-10 DIAGNOSIS — E119 Type 2 diabetes mellitus without complications: Secondary | ICD-10-CM | POA: Insufficient documentation

## 2016-03-10 DIAGNOSIS — E785 Hyperlipidemia, unspecified: Secondary | ICD-10-CM

## 2016-03-10 DIAGNOSIS — I10 Essential (primary) hypertension: Secondary | ICD-10-CM

## 2016-03-10 DIAGNOSIS — I5022 Chronic systolic (congestive) heart failure: Secondary | ICD-10-CM | POA: Diagnosis not present

## 2016-03-10 DIAGNOSIS — Z79899 Other long term (current) drug therapy: Secondary | ICD-10-CM | POA: Insufficient documentation

## 2016-03-10 DIAGNOSIS — Z794 Long term (current) use of insulin: Secondary | ICD-10-CM | POA: Insufficient documentation

## 2016-03-10 DIAGNOSIS — M25579 Pain in unspecified ankle and joints of unspecified foot: Secondary | ICD-10-CM | POA: Insufficient documentation

## 2016-03-10 DIAGNOSIS — I129 Hypertensive chronic kidney disease with stage 1 through stage 4 chronic kidney disease, or unspecified chronic kidney disease: Secondary | ICD-10-CM | POA: Insufficient documentation

## 2016-03-10 LAB — GLUCOSE, POCT (MANUAL RESULT ENTRY)
POC GLUCOSE: 391 mg/dL — AB (ref 70–99)
POC Glucose: 377 mg/dl — AB (ref 70–99)

## 2016-03-10 MED ORDER — ASPIRIN 81 MG PO TBEC: 81.0000 mg | DELAYED_RELEASE_TABLET | Freq: Every day | ORAL | Status: AC

## 2016-03-10 MED ORDER — CARVEDILOL 6.25 MG PO TABS: 6.2500 mg | ORAL_TABLET | Freq: Two times a day (BID) | ORAL | Status: AC

## 2016-03-10 MED ORDER — AMLODIPINE BESYLATE 10 MG PO TABS: 10.0000 mg | ORAL_TABLET | Freq: Every day | ORAL | Status: AC

## 2016-03-10 MED ORDER — LISINOPRIL 5 MG PO TABS: 5.0000 mg | ORAL_TABLET | Freq: Two times a day (BID) | ORAL | Status: AC

## 2016-03-10 MED ORDER — TRAMADOL HCL 50 MG PO TABS: 50.0000 mg | ORAL_TABLET | Freq: Two times a day (BID) | ORAL | Status: AC | PRN

## 2016-03-10 MED ORDER — INSULIN ASPART 100 UNIT/ML IV SOLN: 10.0000 [IU] | Freq: Once | INTRAVENOUS | Status: AC

## 2016-03-10 MED ORDER — FUROSEMIDE 40 MG PO TABS: 40.0000 mg | ORAL_TABLET | Freq: Every day | ORAL | Status: AC

## 2016-03-10 MED ORDER — GLIPIZIDE 10 MG PO TABS: 10.0000 mg | ORAL_TABLET | Freq: Two times a day (BID) | ORAL | Status: AC

## 2016-03-10 MED ORDER — CLONIDINE HCL 0.2 MG PO TABS
0.2000 mg | ORAL_TABLET | Freq: Once | ORAL | Status: AC
  Administered 2016-03-10: 0.2 mg via ORAL

## 2016-03-10 MED FILL — traMADol HCL 50 MG TABS: 50 | 30 days supply | Qty: 60 | Fill #0

## 2016-03-10 MED FILL — ?CARVEDILOL 6.25 MG TABLET: 6.25 | 30 days supply | Qty: 60 | Fill #0

## 2016-03-10 MED FILL — FUROSEMIDE 40 MG TABLET: 40 | 30 days supply | Qty: 30 | Fill #0

## 2016-03-10 MED FILL — ?AMLODIPINE BESYLATE 10 MG: 10 | 30 days supply | Qty: 30 | Fill #0

## 2016-03-10 MED FILL — glipiZIDE 10 MG TABS: 10 | 30 days supply | Qty: 60 | Fill #0

## 2016-03-10 MED FILL — LISINOPRIL 5 MG TABLET: 5 | 30 days supply | Qty: 60 | Fill #0

## 2016-03-10 NOTE — Progress Notes (Signed)
Patient's here for DM.  Patient denies any pain today.  Requesting med refill. Patient been w/o his meds x3wks.  Per Dr. Venetia NightAmao, patient will be given 10 units of in house insulin. Verified by Dr. Venetia NightAmao.  Patient also received 0.2mg  clonidine.

## 2016-03-10 NOTE — Progress Notes (Signed)
CC:  HPI: Louis Trevino is a 60 y.o. male here today for a follow up visit who was last seen here in the clinic 7 months ago with a history of type 2 diabetes mellitus (A1c of 12.3 from 08/2016), systolic CHF (EF of 20-25% from 2-D echo of 07/2013), hypertension who comes into the clinic today because he has run out of medications for the last 3 weeks. He works out of town and does not come into [place] also states he has had a difficult time obtaining appointments and this was the same excuse to give at his last office visit.  His blood pressure is severely elevated and so is his blood sugar; he has also not been compliant with checking his blood sugars at home. He has not had a recent eye exam.  He complains of posterior right ankle pain and states he had trauma to that ankle as a teenager and this occasionally flares up when he has been on his feet for long.   Patient has No headache, No chest pain, No abdominal pain - No Nausea, No new weakness tingling or numbness, No Cough - SOB.  No Known Allergies Past Medical History  Diagnosis Date  . Diabetes mellitus   . Hypertension   . CHF (congestive heart failure) (HCC)    Current Outpatient Prescriptions on File Prior to Visit  Medication Sig Dispense Refill  . ALPRAZolam (XANAX) 0.5 MG tablet Take 1 tablet (0.5 mg total) by mouth daily as needed for anxiety. 30 tablet 0  . naproxen sodium (ANAPROX) 220 MG tablet Take 2 tablets (440 mg total) by mouth daily as needed (for pain). 45 tablet 3   Current Facility-Administered Medications on File Prior to Visit  Medication Dose Route Frequency Provider Last Rate Last Dose  . insulin aspart (novoLOG) injection 10 Units  10 Units Subcutaneous Once Jaclyn ShaggyEnobong Amao, MD       Family History  Problem Relation Age of Onset  . Heart disease Mother   . Stroke Mother   . Kidney failure Maternal Grandfather   . Stroke Paternal Grandmother    Social History   Social History  . Marital  Status: Widowed    Spouse Name: N/A  . Number of Children: N/A  . Years of Education: N/A   Occupational History  . Not on file.   Social History Main Topics  . Smoking status: Never Smoker   . Smokeless tobacco: Not on file  . Alcohol Use: 0.6 - 1.2 oz/week    1-2 Cans of beer per week  . Drug Use: No  . Sexual Activity: Not on file   Other Topics Concern  . Not on file   Social History Narrative    Review of Systems: Constitutional: Negative for fever, chills, diaphoresis, activity change, appetite change and fatigue. HENT: Negative for ear pain, nosebleeds, congestion, facial swelling, rhinorrhea, neck pain, neck stiffness and ear discharge.  Eyes: Negative for pain, discharge, redness, itching and visual disturbance. Respiratory: Negative for cough, choking, chest tightness, shortness of breath, wheezing and stridor.  Cardiovascular: Negative for chest pain, palpitations and leg swelling. Gastrointestinal: Negative for abdominal distention. Genitourinary: Negative for dysuria, urgency, frequency, hematuria, flank pain, decreased urine volume, difficulty urinating and dyspareunia.  Musculoskeletal: See history of present illness Neurological: Negative for dizziness, tremors, seizures, syncope, facial asymmetry, speech difficulty, weakness, light-headedness, numbness and headaches.  Hematological: Negative for adenopathy. Does not bruise/bleed easily. Psychiatric/Behavioral: Negative for hallucinations, behavioral problems, confusion, dysphoric mood, decreased concentration and agitation.  Objective:   Filed Vitals:   03/10/16 1410  BP: 193/117  Pulse: 112  Temp: 98.3 F (36.8 C)  Resp: 18    Physical Exam: Constitutional: Patient appears well-developed and well-nourished. No distress. HENT: Normocephalic, atraumatic, External right and left ear normal. Oropharynx is clear and moist.  Eyes: Conjunctivae and EOM are normal. PERRLA, no scleral icterus. Neck:  Normal ROM. Neck supple. No JVD. No tracheal deviation. No thyromegaly. CVS: Tachycardic rate, regular rhythm, S1/S2 +, no murmurs, no gallops, no carotid bruit.  Pulmonary: Effort and breath sounds normal, no stridor, rhonchi, wheezes, rales.  Abdominal: Soft. BS +,  no distension, tenderness, rebound or guarding.  Musculoskeletal: Normal range of motion. No edema and no tenderness. Right ankle is normal with no tenderness and range of motion.  Lymphadenopathy: No lymphadenopathy noted, cervical, inguinal or axillary Neuro: Alert. Normal reflexes, muscle tone coordination. No cranial nerve deficit. Skin: Skin is warm and dry. No rash noted. Not diaphoretic. No erythema. No pallor. Psychiatric: Normal mood and affect. Behavior, judgment, thought content normal.  Lab Results  Component Value Date   WBC 7.7 11/20/2013   HGB 14.7 11/20/2013   HCT 42.5 11/20/2013   MCV 89.3 11/20/2013   PLT 209 11/20/2013   Lab Results  Component Value Date   CREATININE 1.46* 08/22/2015   BUN 23 08/22/2015   NA 132* 08/22/2015   K 3.6 08/22/2015   CL 96* 08/22/2015   CO2 25 08/22/2015    Lab Results  Component Value Date   HGBA1C 12.30 08/22/2015   Lipid Panel     Component Value Date/Time   CHOL 203* 07/03/2014 1720   TRIG 312* 07/03/2014 1720   HDL 32* 07/03/2014 1720   CHOLHDL 6.3 07/03/2014 1720   VLDL 62* 07/03/2014 1720   LDLCALC 109* 07/03/2014 1720       Assessment and plan:   Type 2 diabetes mellitus: Uncontrolled with A1c of 12.3, A1c today 10 units of NovoLog administered due to CBG of 377 Has been out of glipizide and so A1c is likely to be elevated. Advised to schedule annual eye exam with an optometrist or ophthalmologist.    Accelerated hypertension: Uncontrolled Clonidine 0.2 mg given and patient observed for 40 minutes and blood pressure repeated. Refilled all his antihypertensives and he will need to return for blood pressure reassessment at his next  visit.  Chronic systolic CHF: EF 16-10% Continue Lasix and I will check a complete metabolic panel today. Advised limiting fluid intake to 2 L per day, daily weights, low sodium diet. I have explained to him that he will need a 2-D echo to evaluate cardiac function right now he is not ready for that referral as he states he is usually out of town.  Sinus tachycardia: He has been off his beta blocker hence tachycardia on exam  Chronic ankle pain: Placed on tramadol Holding off on NSAIDs due to chronic kidney disease  Chronic kidney disease: We'll send off CM[facility] today Avoid nephrotoxic agents   Jaclyn Shaggy, MD. [facility] [phone] 03/10/2016, 2:38 PM

## 2016-03-10 NOTE — Telephone Encounter (Signed)
Pt. Came was seen today for an appointment and forgot to mention to his PCP that he needed a refill on  ALPRAZolam (XANAX) 0.5 MG tablet. Please f/u with pt.

## 2016-03-11 DIAGNOSIS — E785 Hyperlipidemia, unspecified: Secondary | ICD-10-CM | POA: Insufficient documentation

## 2016-03-11 LAB — POCT URINALYSIS DIPSTICK
BILIRUBIN UA: NEGATIVE
GLUCOSE UA: 500
KETONES UA: NEGATIVE
Leukocytes, UA: NEGATIVE
Nitrite, UA: NEGATIVE
PH UA: 6.5
PROTEIN UA: NEGATIVE
RBC UA: NEGATIVE
Spec Grav, UA: 1.015
Urobilinogen, UA: 0.2

## 2016-03-11 LAB — COMPLETE METABOLIC PANEL WITH GFR
ALBUMIN: 3.7 g/dL (ref 3.6–5.1)
ALK PHOS: 98 U/L (ref 40–115)
ALT: 21 U/L (ref 9–46)
AST: 15 U/L (ref 10–35)
BILIRUBIN TOTAL: 0.6 mg/dL (ref 0.2–1.2)
BUN: 21 mg/dL (ref 7–25)
CO2: 29 mmol/L (ref 20–31)
CREATININE: 1.32 mg/dL (ref 0.70–1.33)
Calcium: 8.9 mg/dL (ref 8.6–10.3)
Chloride: 95 mmol/L — ABNORMAL LOW (ref 98–110)
GFR, Est African American: 68 mL/min (ref >=60)
GFR, Est Non African American: 59 mL/min — ABNORMAL LOW (ref >=60)
GLUCOSE: 381 mg/dL — AB (ref 65–99)
Potassium: 3.6 mmol/L (ref 3.5–5.3)
SODIUM: 132 mmol/L — AB (ref 135–146)
TOTAL PROTEIN: 6.8 g/dL (ref 6.1–8.1)

## 2016-03-11 LAB — LIPID PANEL
CHOLESTEROL: 236 mg/dL — AB (ref 125–200)
HDL: 29 mg/dL — AB (ref >=40)
TRIGLYCERIDES: 816 mg/dL — AB (ref <150)
Total CHOL/HDL Ratio: 8.1 Ratio — ABNORMAL HIGH (ref <=5.0)

## 2016-03-11 LAB — MICROALBUMIN / CREATININE URINE RATIO
CREATININE, URINE: 79 mg/dL (ref 20–370)
MICROALB UR: 221.5 mg/dL
Microalb Creat Ratio: 2804 mcg/mg creat — ABNORMAL HIGH (ref <30)

## 2016-03-11 MED ORDER — ATORVASTATIN CALCIUM 40 MG PO TABS: 40.0000 mg | ORAL_TABLET | Freq: Every day | ORAL | Status: AC

## 2016-03-11 NOTE — Telephone Encounter (Signed)
Please inform him his cholesterol medicine regularly elevated and so I have placed him on atorvastatin. His urine also shows microalbuminuria which is secondary to poorly controlled diabetes mellitus. Regarding his Xanax I previously informed him he received this from a previous clinician and I would not be keeping him on this medication.

## 2016-03-11 NOTE — Addendum Note (Signed)
Addended by: Benjamin StainBENNETT-CURSE, Kobi Mario L on: 03/11/2016 12:52 PM   Modules accepted: Orders

## 2016-03-18 ENCOUNTER — Telehealth: Payer: Self-pay

## 2016-03-18 ENCOUNTER — Other Ambulatory Visit: Payer: Self-pay

## 2016-03-18 DIAGNOSIS — I10 Essential (primary) hypertension: Secondary | ICD-10-CM

## 2016-03-18 MED ORDER — ALPRAZOLAM 0.5 MG PO TABS
0.5000 mg | ORAL_TABLET | Freq: Every day | ORAL | Status: AC | PRN
Start: 2016-03-18 — End: ?

## 2016-03-18 NOTE — Telephone Encounter (Signed)
Opened in error

## 2016-03-18 NOTE — Telephone Encounter (Addendum)
Placed call to patient, patient did not answer. Message was left for patient to return my call.   Note to patient:  Please inform him his cholesterol medicine regularly elevated and so I have placed him on atorvastatin. His urine also shows microalbuminuria which is secondary to poorly controlled diabetes mellitus. Regarding his Xanax I previously informed him he received this from a previous clinician and I would not be keeping him on this medication.

## 2016-04-13 MED FILL — FUROSEMIDE 40 MG TABLET: 40 | 30 days supply | Qty: 30 | Fill #1

## 2016-04-13 MED FILL — ?CARVEDILOL 6.25 MG TABLET: 6.25 | 30 days supply | Qty: 60 | Fill #1

## 2016-04-13 MED FILL — ?AMLODIPINE BESYLATE 10 MG: 10 | 30 days supply | Qty: 30 | Fill #1

## 2016-04-13 MED FILL — LISINOPRIL 5 MG TABLET: 5 | 30 days supply | Qty: 60 | Fill #1

## 2016-04-21 MED FILL — FUROSEMIDE 40 MG TABLET: 40 | 30 days supply | Qty: 30 | Fill #2

## 2016-04-21 MED FILL — AMLODIPINE BESYLATE 10 MG T: 10 | 30 days supply | Qty: 30 | Fill #2

## 2016-04-21 MED FILL — CARVEDILOL 6.25 MG TABLET: 6.25 | 30 days supply | Qty: 60 | Fill #2

## 2016-04-21 MED FILL — ?LISINOPRIL 5 MG TABLET: 5 | 30 days supply | Qty: 60 | Fill #2

## 2016-04-23 MED FILL — ATORVASTATIN 40 MG TABLET: 40 | 30 days supply | Qty: 30 | Fill #0

## 2016-07-01 MED FILL — ?ATORVASTATIN 40MG TABLET: 40 | 30 days supply | Qty: 30 | Fill #1

## 2016-07-01 MED FILL — LISINOPRIL 5 MG TABLET: 5 | 30 days supply | Qty: 60 | Fill #3

## 2016-07-01 MED FILL — CARVEDILOL 6.25 MG TABLET: 6.25 | 30 days supply | Qty: 60 | Fill #3

## 2016-07-01 MED FILL — AMLODIPINE BESYLATE 10 MG T: 10 | 30 days supply | Qty: 30 | Fill #3

## 2016-07-01 MED FILL — ?GLIPIZIDE 10 MG TABLET: 10 | 1 days supply | Qty: 3 | Fill #1

## 2016-07-01 MED FILL — ?FUROSEMIDE 40 MG TABLET: 40 | 30 days supply | Qty: 30 | Fill #3
